# Patient Record
Sex: Male | Born: 1995 | Hispanic: Refuse to answer | Marital: Single | Smoking: Never smoker
Health system: Southern US, Community
[De-identification: ages and names within clinical notes are randomized; demographics above are authoritative.]

## PROBLEM LIST (undated history)

## (undated) DIAGNOSIS — K2970 Gastritis, unspecified, without bleeding: Secondary | ICD-10-CM

## (undated) DIAGNOSIS — K279 Peptic ulcer, site unspecified, unspecified as acute or chronic, without hemorrhage or perforation: Secondary | ICD-10-CM

## (undated) DIAGNOSIS — R55 Syncope and collapse: Secondary | ICD-10-CM

## (undated) DIAGNOSIS — K219 Gastro-esophageal reflux disease without esophagitis: Secondary | ICD-10-CM

## (undated) DIAGNOSIS — E876 Hypokalemia: Secondary | ICD-10-CM

## (undated) DIAGNOSIS — R000 Tachycardia, unspecified: Secondary | ICD-10-CM

## (undated) HISTORY — DX: Gastro-esophageal reflux disease without esophagitis: K21.9

## (undated) HISTORY — DX: Tachycardia, unspecified: R00.0

## (undated) HISTORY — DX: Gastritis, unspecified, without bleeding: K29.70

## (undated) HISTORY — DX: Syncope and collapse: R55

## (undated) HISTORY — DX: Hypomagnesemia: E83.42

## (undated) HISTORY — DX: Peptic ulcer, site unspecified, unspecified as acute or chronic, without hemorrhage or perforation: K27.9

## (undated) HISTORY — DX: Hypokalemia: E87.6

---
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
## 2013-01-02 ENCOUNTER — Telehealth: Payer: Self-pay

## 2013-01-02 NOTE — Telephone Encounter (Signed)
Called pt to schedule flup per dr Kirke Corin, states he is trying to get things worked out with his insurance and he will call back toward the end of the week.

## 2013-01-02 NOTE — Telephone Encounter (Signed)
Per dr Kirke Corin.Marland KitchenMarland KitchenAmun Carr (DOB 1996/01/10): schedule him for a new office visit. Referred from [facility] ER for SVT. Unable to leave msg x 2...the patient vm full

## 2013-01-05 ENCOUNTER — Ambulatory Visit: Payer: Self-pay | Admitting: Cardiovascular Disease

## 2013-01-16 ENCOUNTER — Encounter: Payer: Self-pay | Admitting: *Deleted

## 2013-01-17 ENCOUNTER — Encounter: Payer: Self-pay | Admitting: *Deleted

## 2013-01-19 ENCOUNTER — Ambulatory Visit (INDEPENDENT_AMBULATORY_CARE_PROVIDER_SITE_OTHER): Payer: Self-pay | Admitting: Cardiovascular Disease

## 2013-01-19 ENCOUNTER — Encounter: Payer: Self-pay | Admitting: Cardiovascular Disease

## 2013-01-19 VITALS — BP 116/78 | HR 82 | Ht 68.0 in | Wt 178.5 lb

## 2013-01-19 DIAGNOSIS — R000 Tachycardia, unspecified: Secondary | ICD-10-CM

## 2013-01-19 DIAGNOSIS — E876 Hypokalemia: Secondary | ICD-10-CM

## 2013-01-19 MED ORDER — METOPROLOL TARTRATE 25 MG PO TABS: 25.0000 mg | ORAL_TABLET | Freq: Two times a day (BID) | ORAL | Status: AC

## 2013-01-19 NOTE — Assessment & Plan Note (Signed)
The EKGs that I reviewed her old suggestive of sinus tachycardia. However, there is mention of supraventricular tachycardia as well. This has been happening in the setting of severe electrolyte abnormalities including hypokalemia and hypomagnesemia. Echocardiogram last year showed no evidence of structural heart disease. I think the priority is to treat his underlying electrolyte abnormalities. I recommended treatment with metoprolol 25 mg twice daily as needed for tachycardia in the meanwhile.

## 2013-01-19 NOTE — Patient Instructions (Addendum)
Start Metoprolol 25 mg as needed for fast heart beats /palpitations.   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    Follow up in 3 months.

## 2013-01-19 NOTE — Assessment & Plan Note (Signed)
This seems to be a medical mystery. 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 However, there is no family history of this. I recommend further endocrinology evaluation at a tertiary care center.

## 2013-01-19 NOTE — Progress Notes (Signed)
HPI  This is a 17 year old man who is referred for evaluation of tachycardia. He has recurrent presentations with unexplained hypokalemia, hypomagnesemia, weakness and associated palpitations and narrow complex tachycardia most of the time sinus tachycardia. This has been going on since last year and has required 5 hospital admissions. He presents with sudden symptoms of weakness, shakiness, occasional syncope with palpitations and dizziness. During all these episodes, he was noted to be severely hypokalemic (potassium of less than 3) with hypomagnesemia as well. He underwent extensive workup nephrology and was seen by Dr. Cherylann Ratel. He underwent diuretics screen which was negative. He was seen by Dr. Tedd Sias from endocrinology as well without obvious cause. His vital function has been normal. There is no family history of something similar. He has been taking potassium and magnesium supplements routinely. He denies taking any other supplements or over-the-counter medications. He denies any recreational drug use. He had routine labs done with his primary care physician on June 24 which showed a potassium of 4.5 and magnesium of 1.8. He presented to [facility] emergency room on June 27 with his usual symptoms and was noted to be hypokalemic with a potassium of 2.9 and magnesium of 1.2. He was tachycardic with a heart rate of 139 beats per minute and responded to oral metoprolol.  He had an echocardiogram done in October of last year which showed normal LV systolic function without significant valvular abnormalities.   No Known Allergies   Current Outpatient Prescriptions on File Prior to Visit  Medication Sig Dispense Refill  . magnesium oxide (MAG-OX) 400 MG tablet Take 400 mg by mouth 2 (two) times daily.      . ondansetron (ZOFRAN) 4 MG tablet Take 4 mg by mouth every 8 (eight) hours as needed for nausea.      . potassium chloride SA (K-DUR,KLOR-CON) 20 MEQ tablet Take 20 mEq by mouth 2 (two) times daily.        No current facility-administered medications on file prior to visit.     Past Medical History  Diagnosis Date  . Hypokalemia   . Hypomagnesemia   . Gastritis   . Peptic ulcer disease   . GERD (gastroesophageal reflux disease)   . Syncope and collapse   . Tachycardia      History reviewed. No pertinent past surgical history.   Family History  Problem Relation Age of Onset  . Hypertension Mother   . Hyperlipidemia Mother   . Hypertension Father      History   Social History  . Marital Status: Single    Spouse Name: N/A    Number of Children: N/A  . Years of Education: N/A   Occupational History  . Not on file.   Social History Main Topics  . Smoking status: Never Smoker   . Smokeless tobacco: Not on file  . Alcohol Use: No  . Drug Use: No  . Sexually Active: Not on file   Other Topics Concern  . Not on file   Social History Narrative  . No narrative on file     ROS At 10 point review of system was performed and is negative other than what is mentioned in the history of present illness.    PHYSICAL EXAM   BP 116/78  Pulse 82  Ht 5\' 8"  (1.727 m)  Wt 178 lb 8 oz (80.967 kg)  BMI 27.15 kg/m2 Constitutional: He is oriented to person, place, and time. He appears well-developed and well-nourished. No distress.  HENT: No  nasal discharge.  Head: Normocephalic and atraumatic.  Eyes: Pupils are equal and round. Right eye exhibits no discharge. Left eye exhibits no discharge.  Neck: Normal range of motion. Neck supple. No JVD present. No thyromegaly present.  Cardiovascular: Normal rate, regular rhythm, normal heart sounds and. Exam reveals no gallop and no friction rub. No murmur heard.  Pulmonary/Chest: Effort normal and breath sounds normal. No stridor. No respiratory distress. He has no wheezes. He has no rales. He exhibits no tenderness.  Abdominal: Soft. Bowel sounds are normal. He exhibits no distension. There is no tenderness. There is no  rebound and no guarding.  Musculoskeletal: Normal range of motion. He exhibits no edema and no tenderness.  Neurological: He is alert and oriented to person, place, and time. Coordination normal.  Skin: Skin is warm and dry. No rash noted. He is not diaphoretic. No erythema. No pallor.  Psychiatric: He has a normal mood and affect. His behavior is normal. Judgment and thought content normal.       ZOX:WRUEAV sinus rhythm with T wave changes in the inferior and anterior leads.  ASSESSMENT AND PLAN

## 2013-01-25 ENCOUNTER — Encounter: Payer: Self-pay | Admitting: *Deleted

## 2013-03-23 ENCOUNTER — Encounter: Payer: Self-pay | Admitting: *Deleted

## 2013-03-23 ENCOUNTER — Ambulatory Visit: Payer: Self-pay | Admitting: Cardiovascular Disease

## 2014-02-09 IMAGING — CR RIGHT THUMB 2+V
1 series · 3 of 3 positions shown · non-contrast
Comparison: none

REASON FOR EXAM: injury
COMMENTS:

PROCEDURE:     DXR - DXR THUMB RIGHT HAND (1ST DIGIT)  - February 20, 2012  [DATE]
RESULT:     Comparison:  None

[Series 1: x finger obl right · 0.14mm/px · 3 of 3 slices shown]
[im 1/3]
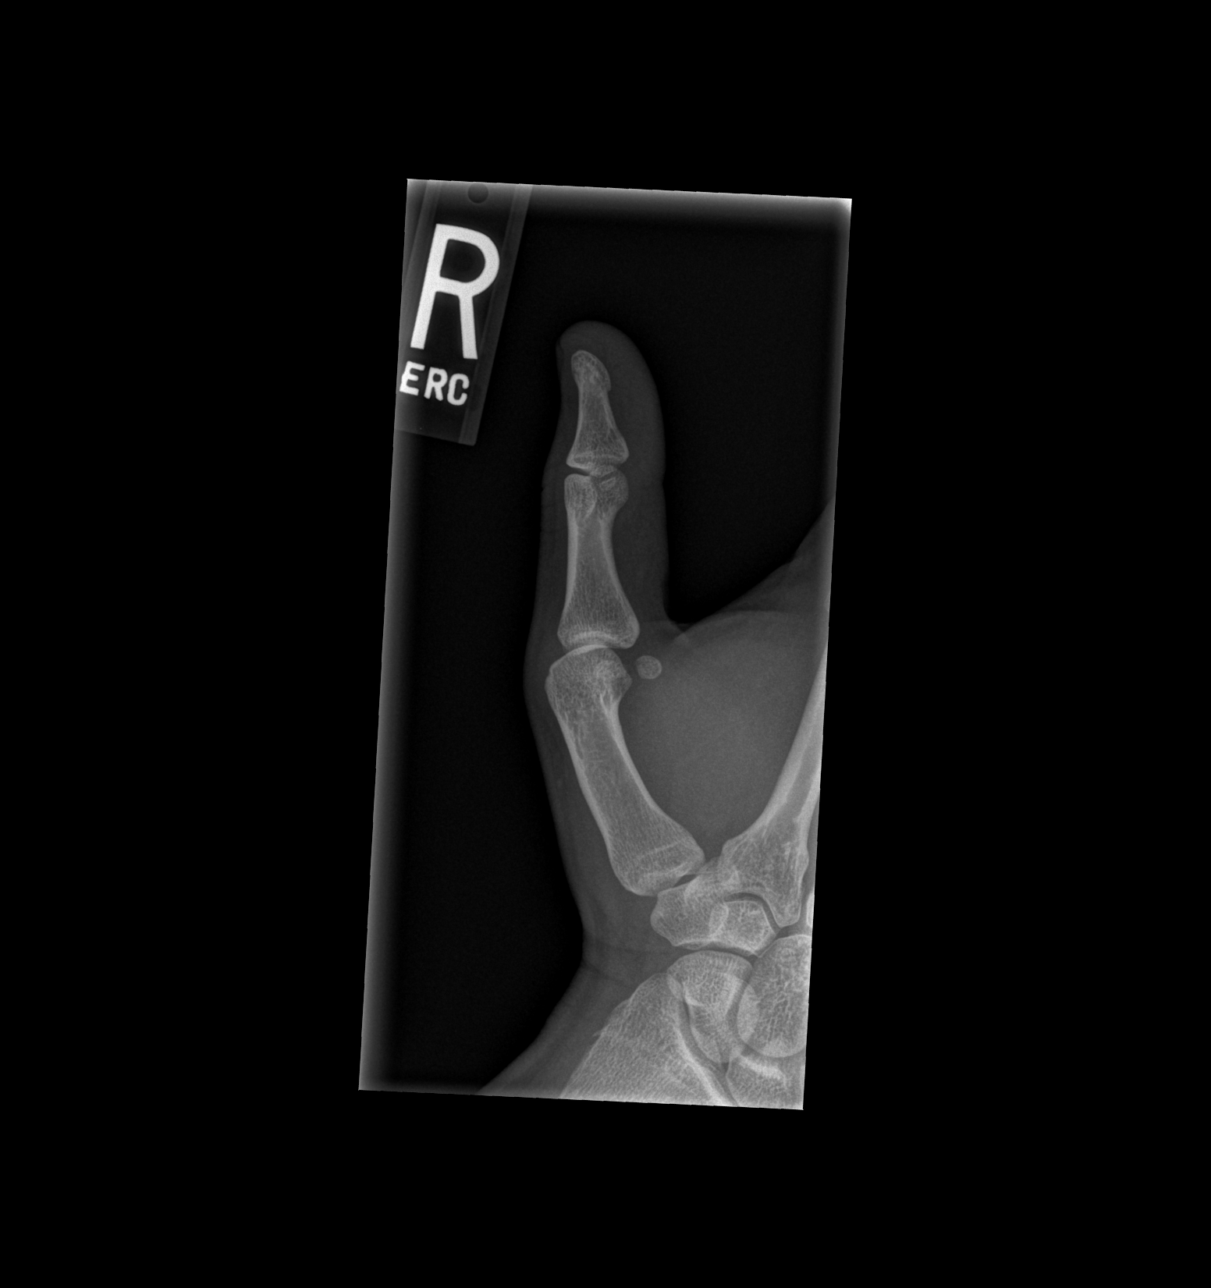
[im 2/3]
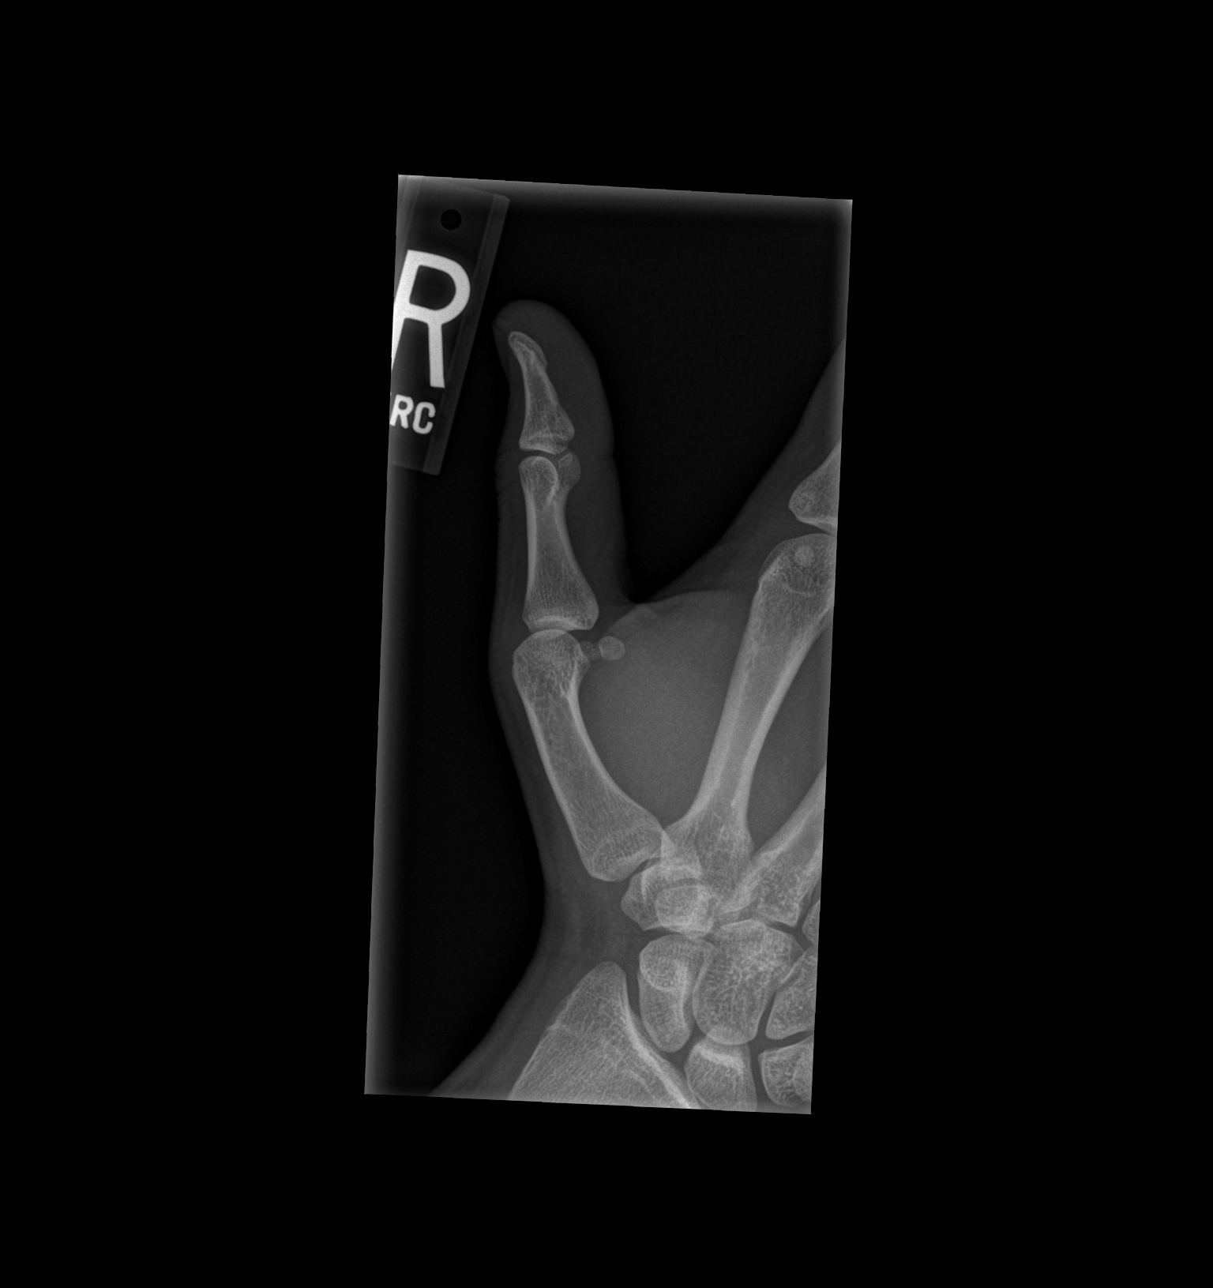
[im 3/3]
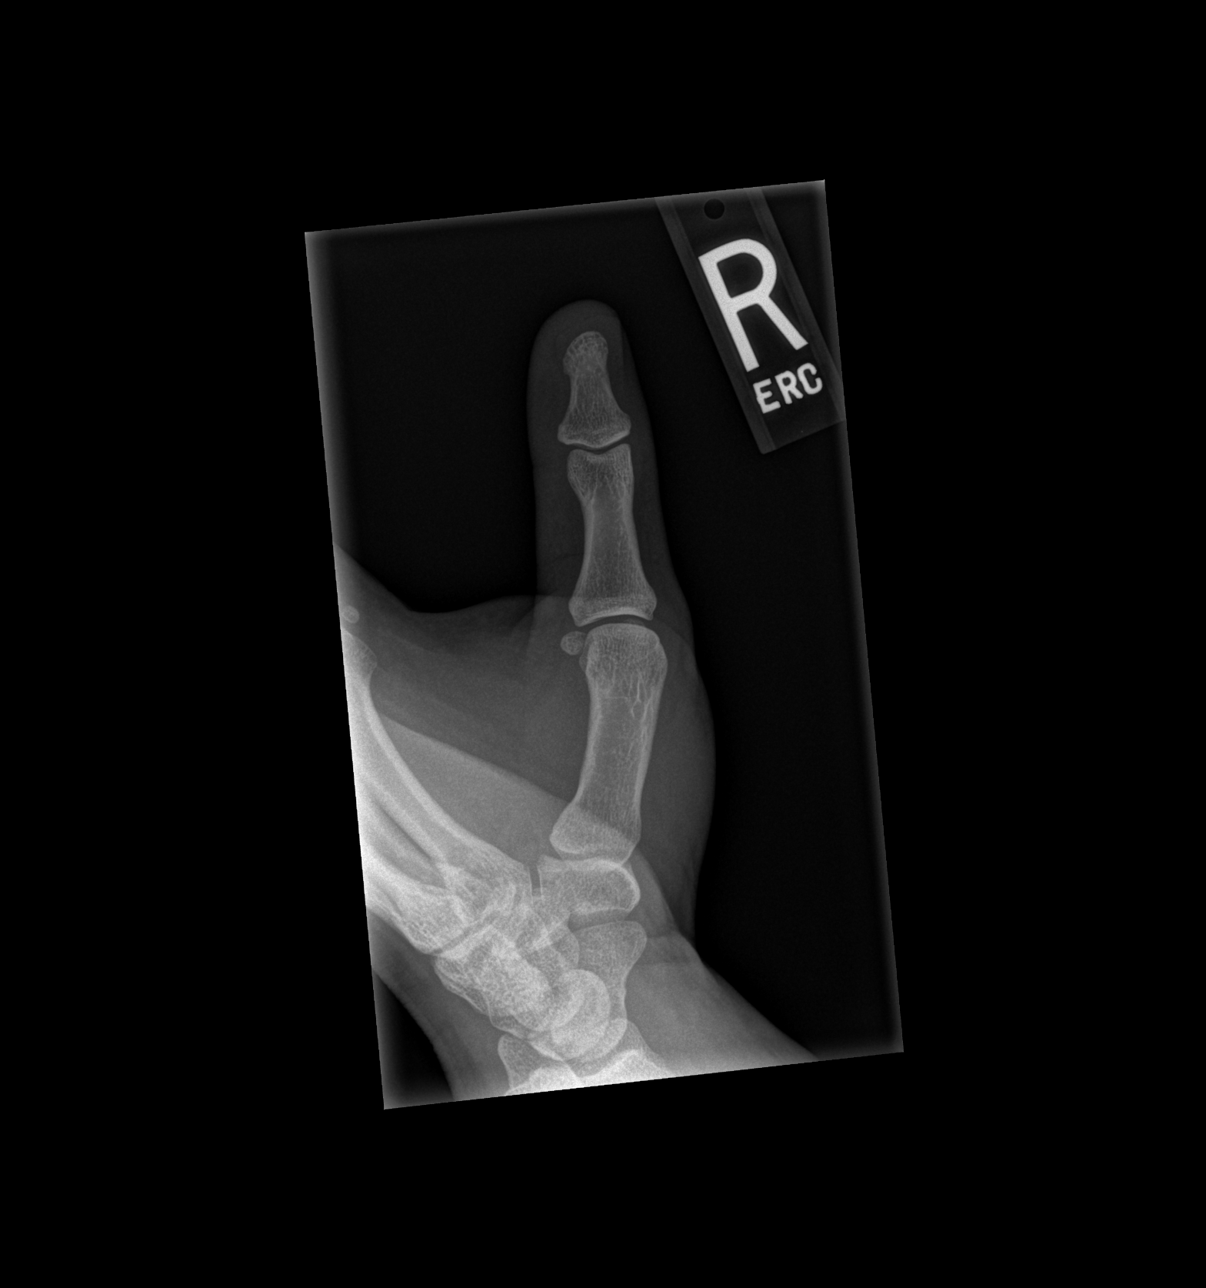

[3 of 3 positions shown; findings below may reference images not displayed]

FINDINGS: Three coned-down views of the right thumb demonstrates no fracture or
dislocation. The soft tissues are normal.
IMPRESSION: No acute osseous injury of the right thumb.

[REDACTED]

## 2014-02-12 IMAGING — CR DG CHEST 2V
1 series · 2 of 2 positions shown · non-contrast
Comparison: none

REASON FOR EXAM: pain
COMMENTS:

[Series 4: w chest pa · 0.14mm/px · 2 of 2 slices shown]
[im 1/2]
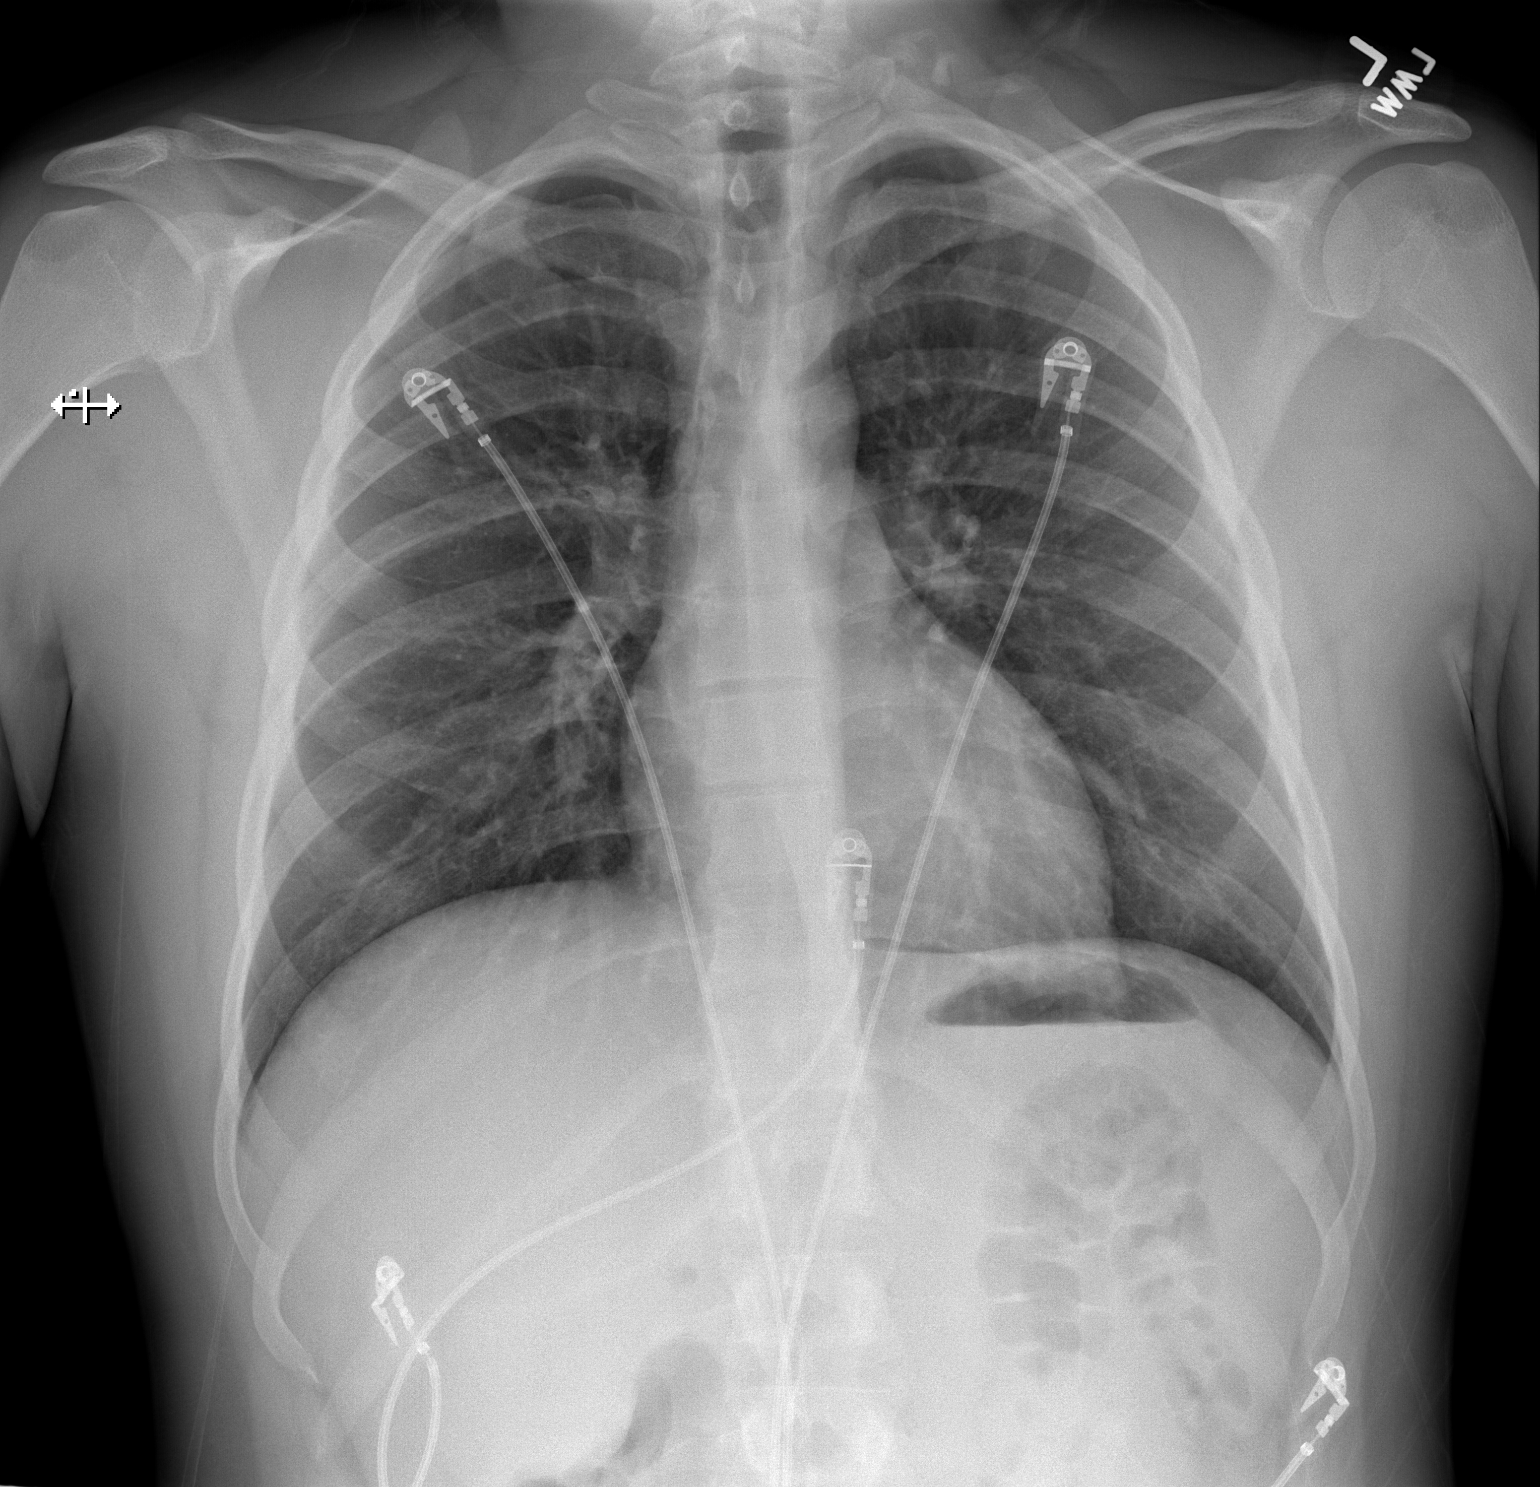
[im 2/2]
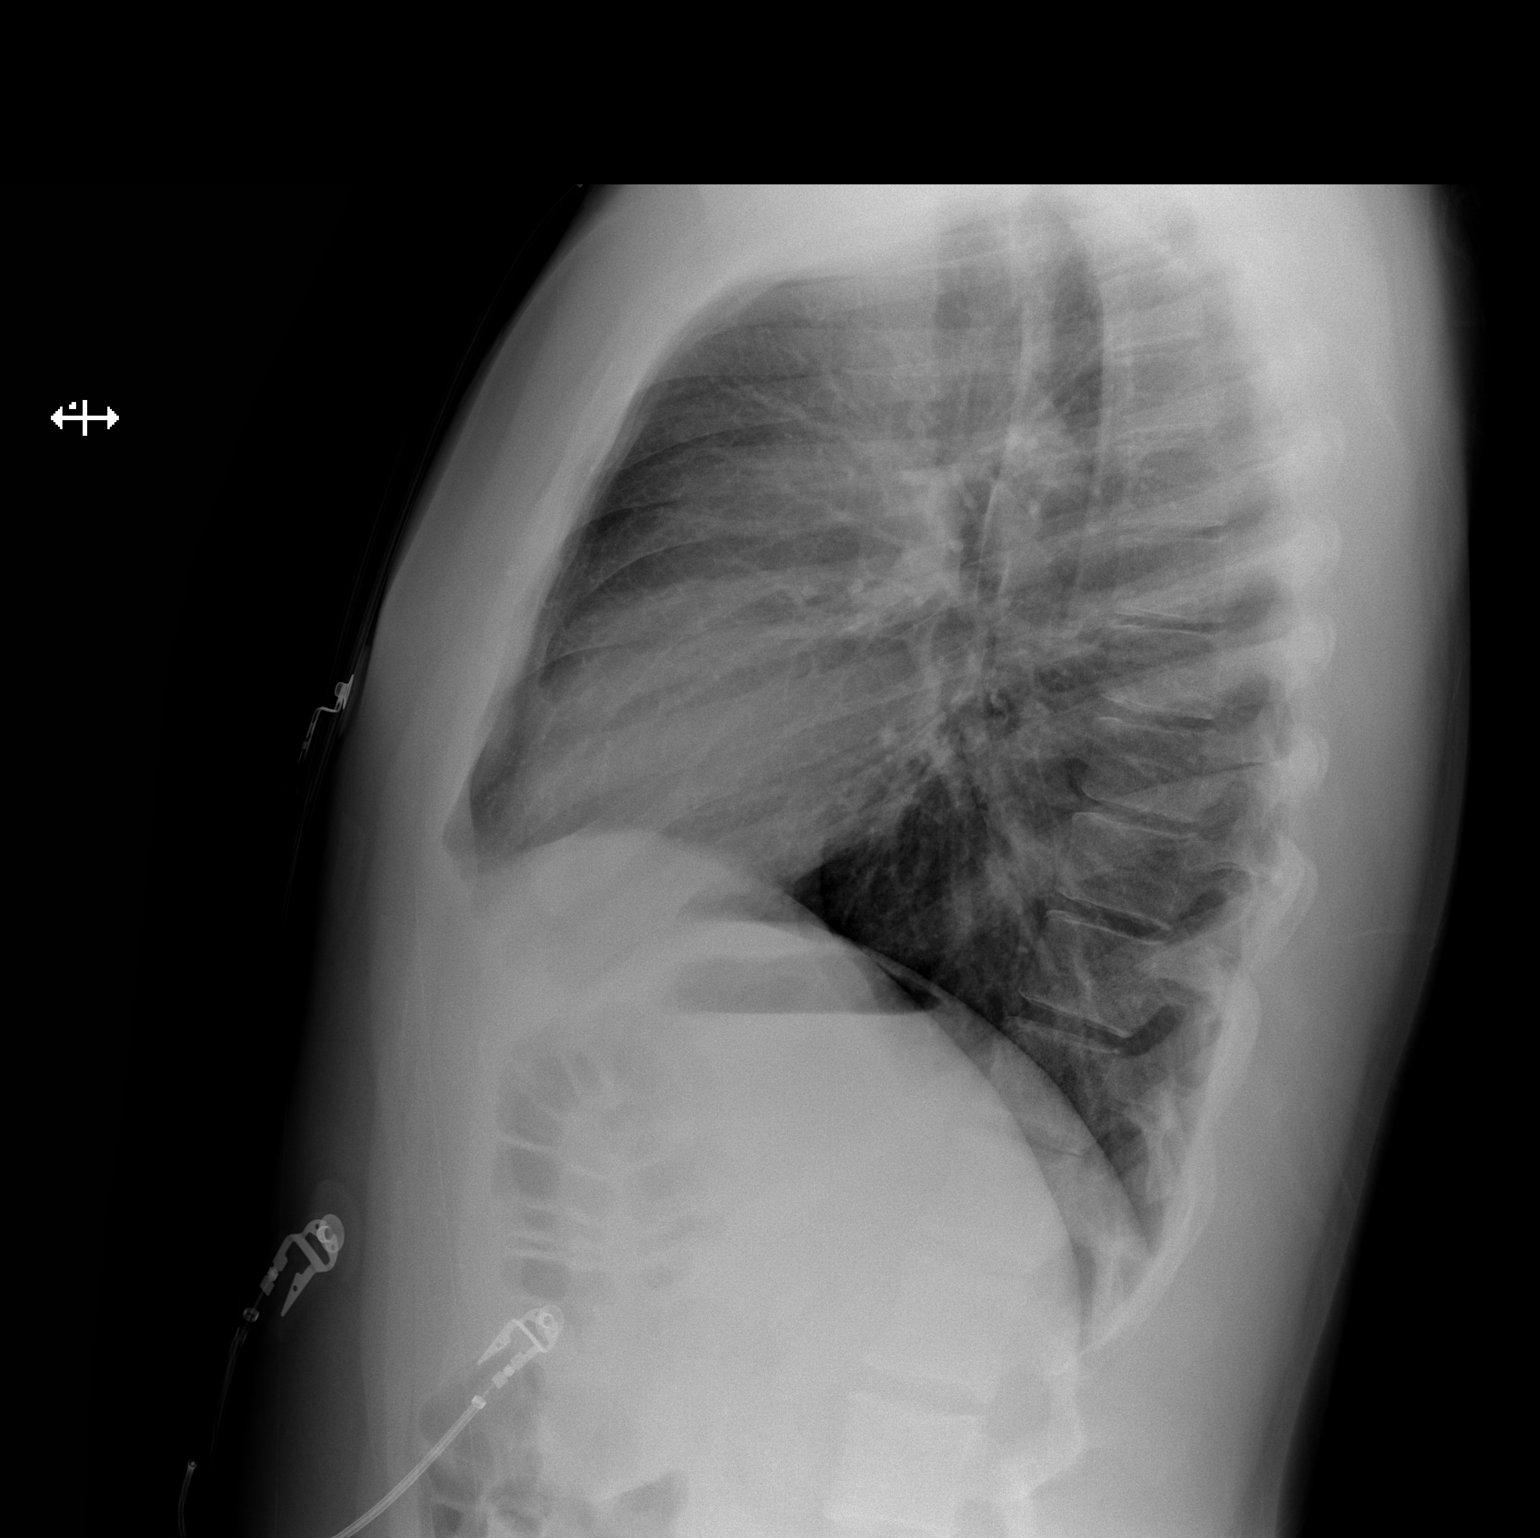

[2 of 2 positions shown; findings below may reference images not displayed]

PROCEDURE:     DXR - DXR CHEST PA (OR AP) AND LATERAL  - February 23, 2012  [DATE]

RESULT:     The lungs are adequately inflated. The interstitial markings are
mildly increased especially in the perihilar regions. The cardiac silhouette
is normal in size. The pulmonary vascularity is not engorged. There is no
pleural effusion. The mediastinum is normal in width.
IMPRESSION: There are mildly increased interstitial markings which may
reflect subsegmental atelectasis or very early interstitial type pneumonia.
There is no pleural effusion. A followup PA and lateral chest x-ray
following therapy would be useful.

[REDACTED]

## 2014-04-25 IMAGING — US US RENAL KIDNEY
1 series · 14 of 25 positions shown · non-contrast
Comparison: none

REASON FOR EXAM: evaluate for adrenal glands.
COMMENTS:

PROCEDURE:     US  - US KIDNEY  - May 05, 2012  [DATE]
RESULT:     The right kidney measures 10.17 x 6.19 x 6.11 cm. The left
kidney measures 10.97 x 5.6 x 5.14 cm. There is a small amount of urine in
the bladder. Neither kidney shows a stone, mass or cyst. No obstructive
change is evident.

[Series 1: us renal kidney · 0.28mm/px · 14 of 60 slices shown]
[im 1/60]
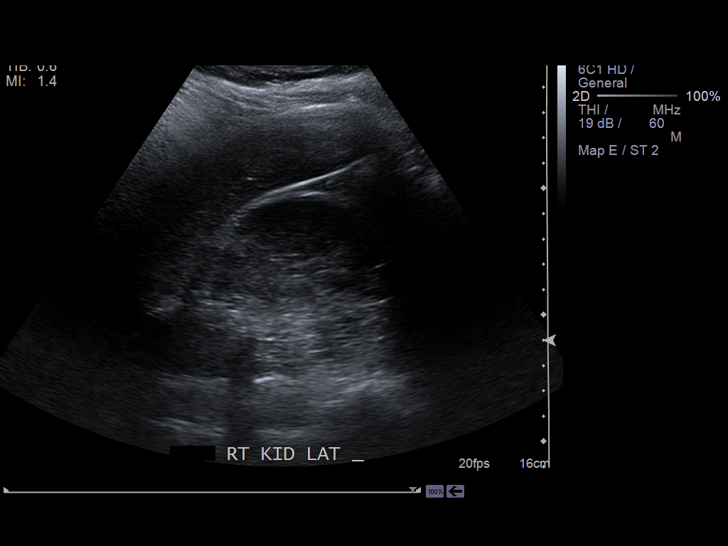
[im 5/60]
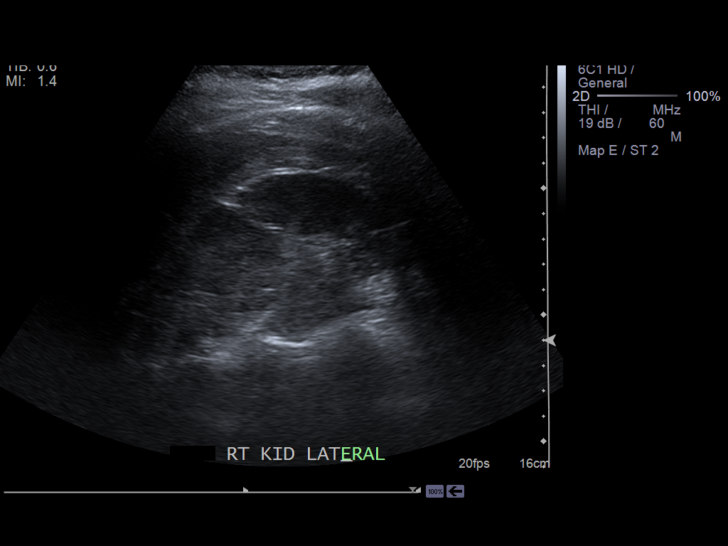
[im 10/60]
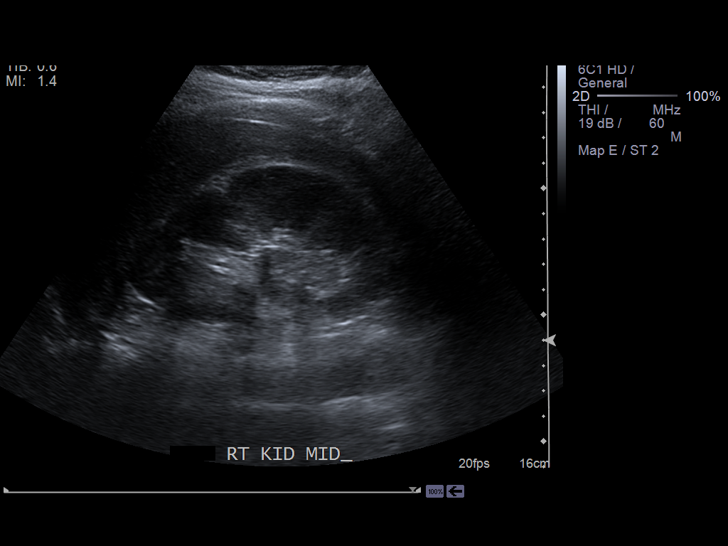
[im 15/60]
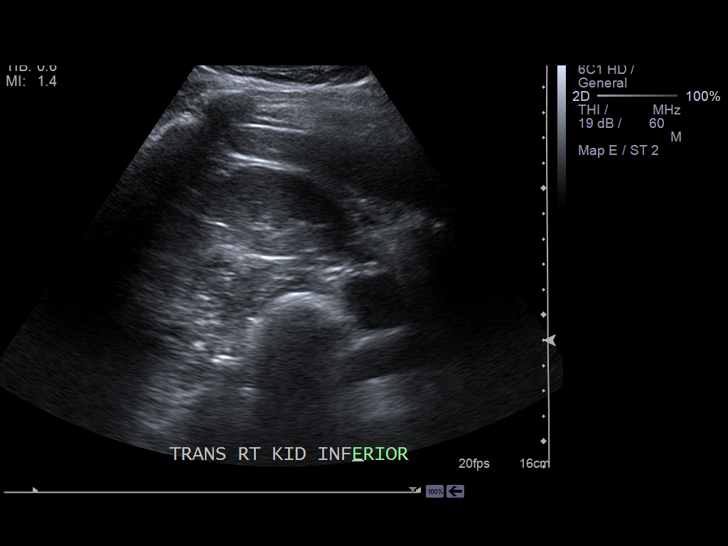
[im 20/60]
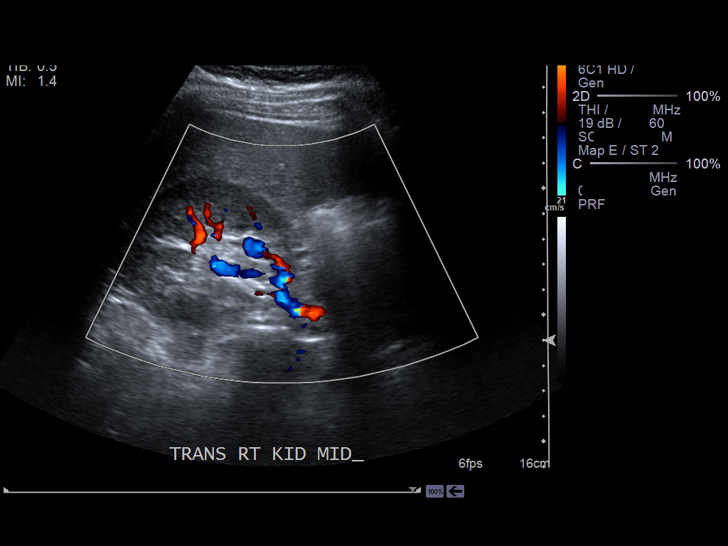
[im 23/60]
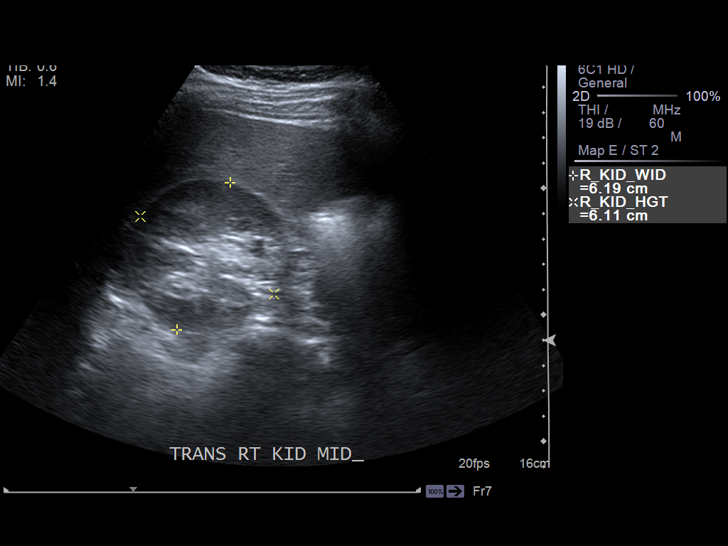
[im 28/60]
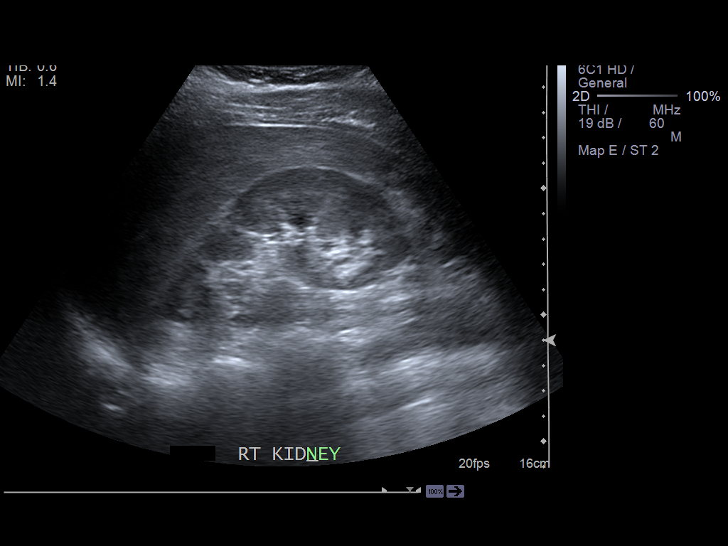
[im 32/60]
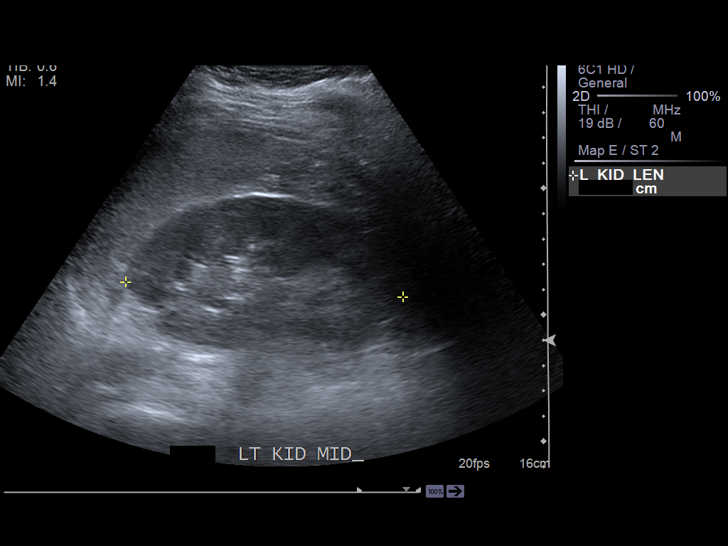
[im 37/60]
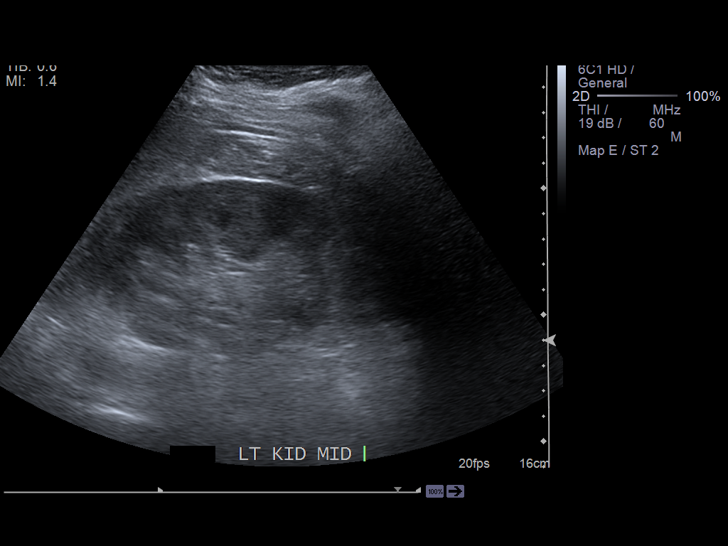
[im 40/60]
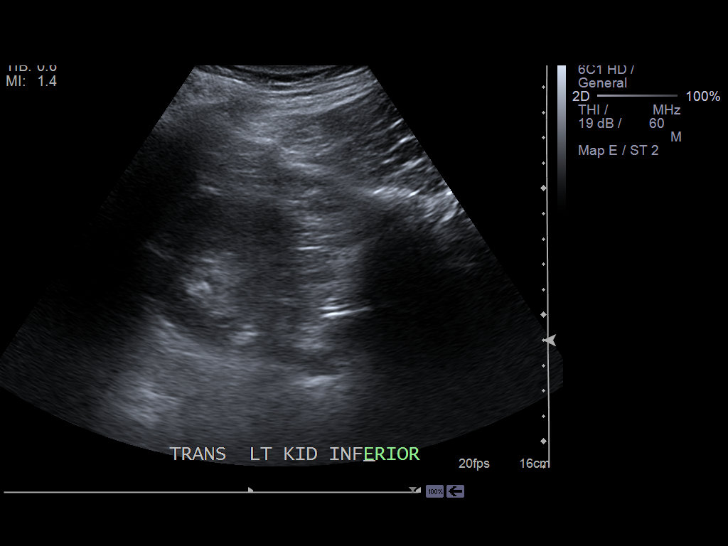
[im 45/60]
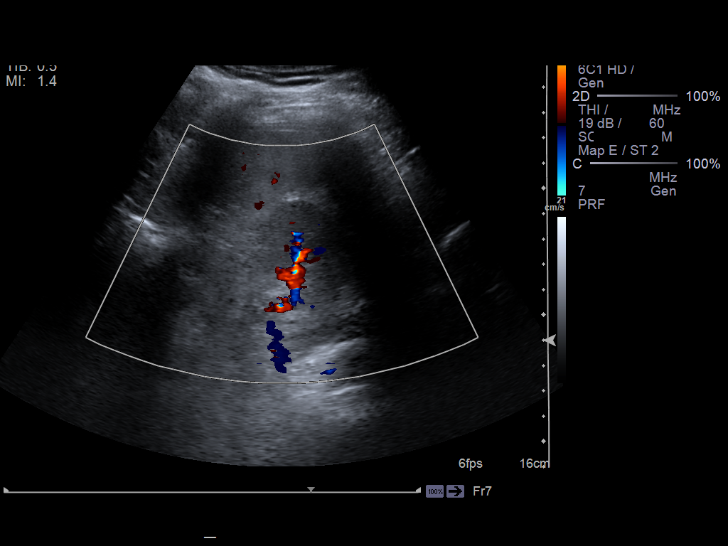
[im 50/60]
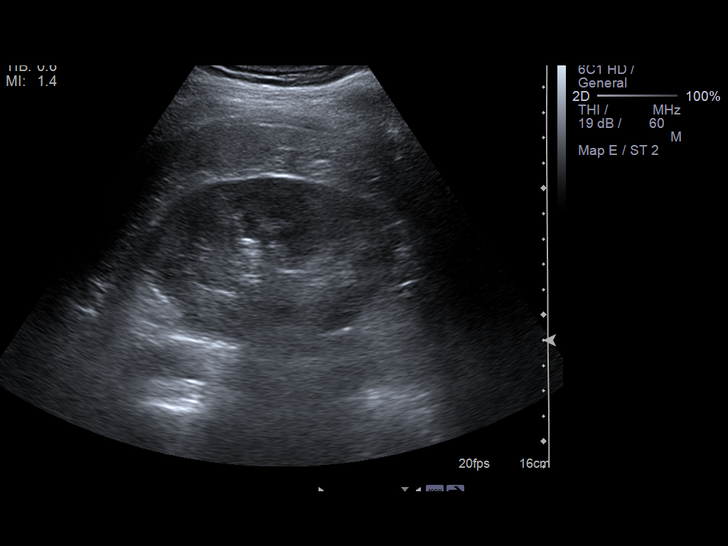
[im 55/60]
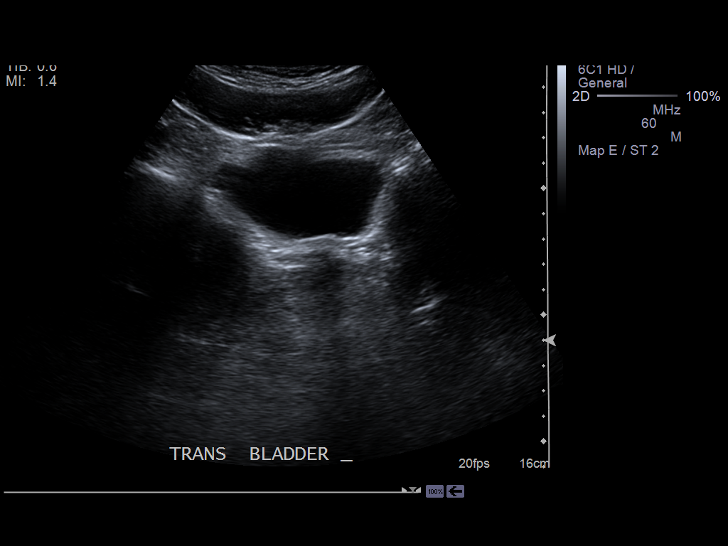
[im 60/60]
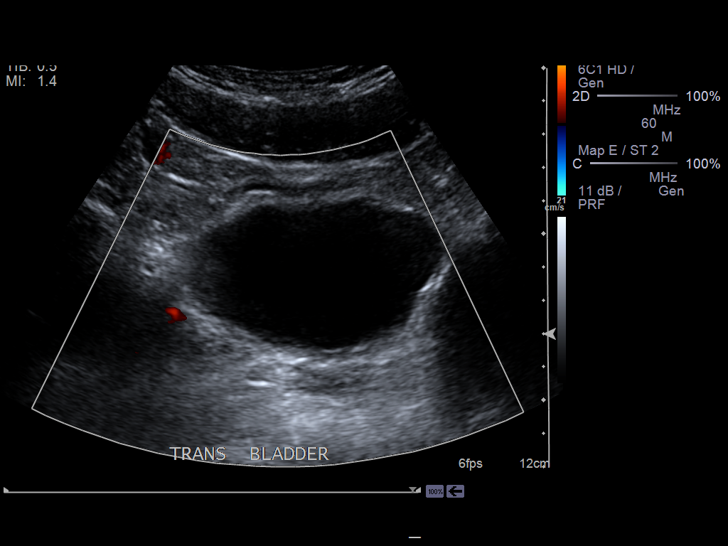

[14 of 25 positions shown; findings below may reference images not displayed]

IMPRESSION: 1. No focal abnormality evident.

[REDACTED]

## 2014-10-23 NOTE — Consult Note (Signed)
Chief Complaint:   Subjective/Chief Complaint feeling about the same, possible a little better. no emesis overnight.   VITAL SIGNS/ANCILLARY NOTES: **Vital Signs.:   02-Sep-13 08:15   Vital Signs Type Q 4hr   Temperature Temperature (F) 97.8   Celsius 36.5   Temperature Source Oral   Pulse Pulse 77   Pulse source if not from Vital Sign Device per Telemetry Clerk   Respirations Respirations 18   Systolic BP Systolic BP 696   Diastolic BP (mmHg) Diastolic BP (mmHg) 73   Mean BP 86   Pulse Ox % Pulse Ox % 99   Oxygen Delivery Room Air/ 21 %   Telemetry pattern Cardiac Rhythm Normal sinus rhythm; pattern reported by Telemetry Clerk  *Intake and Output.:   Shift 02-Sep-13 15:00   Length of Stay Totals Intake:  2861 Output:  975    Net:  1886   Brief Assessment:   Cardiac Regular    Respiratory clear BS    Gastrointestinal details normal Soft  Nondistended  No masses palpable  Bowel sounds normal  No rebound tenderness  mild tenderness generalized except the rlq, moredo noted in the luq medially   Lab Results: Routine Chem:  02-Sep-13 03:49    Glucose, Serum 86   BUN  5   Creatinine (comp) 1.05   Sodium, Serum 141   Potassium, Serum 3.5   Chloride, Serum  110   CO2, Serum  27   Calcium (Total), Serum  8.6   Anion Gap  4   Osmolality (calc) 278   eGFR (African American) >60   eGFR (Non-African American) >60 (eGFR values <43m/min/1.73 m2 may be an indication of chronic kidney disease (CKD). Calculated eGFR is useful in patients with stable renal function. The eGFR calculation will not be reliable in acutely ill patients when serum creatinine is changing rapidly. It is not useful in  patients on dialysis. The eGFR calculation may not be applicable to patients at the low and high extremes of body sizes, pregnant women, and vegetarians.)   Magnesium, Serum  1.5 (1.8-2.4 THERAPEUTIC RANGE: 4-7 mg/dL TOXIC: > 10 mg/dL  -----------------------)  Routine Hem:  02-Sep-13  03:49    WBC (CBC) 6.0 (Result(s) reported on 07 Mar 2012 at 05:12AM.)   Assessment/Plan:  Assessment/Plan:   Assessment 1) luq pain, n/v improving. suspicion of PUDz in the setting of nsaid use.    Plan 1) egd today, I have discussed the risks benefits and complications of egd tininclude not limited to bleeding infectio perforation and sedation and he/parents wish to proceed.  Further recs to follow.   Electronic Signatures: SLoistine Simas(MD)  (Signed 02-Sep-13 09:10)  Authored: Chief Complaint, VITAL SIGNS/ANCILLARY NOTES, Brief Assessment, Lab Results, Assessment/Plan   Last Updated: 02-Sep-13 09:10 by SLoistine Simas(MD)

## 2014-10-23 NOTE — Consult Note (Signed)
Chief Complaint and History:   Referring Physician Dr. Allena KatzPatel    Chief Complaint Hypokalemia   Allergies:  No Known Allergies:   Assessment/Plan:   Assessment/Plan 19 year-old male seen in consultation. He was interviewed and examined and chart was reviewed. He has a history of recurrent hypokalemia, hypomagnesemia, and hypocalcemia. This is his 3rd admission since August. He presented yesterday with altered mental status, tachycardia, and was found to have a potassium level of 2.1, Mg level of 1.4, and calcium level of 8.5. His mental status has normalized. Pt and his mother provided the history. He denies N/V/diarrhea. He denies use of diuretics or laxatives. His potassium has normalized after supplemetation. His TSH is wnl. He has multiple labs ordered at this time. I plan to additionally check a morning ACTH and cortisol as well as a PTH level.   A full consult will be dictated.  I will follow with you.   Electronic Signatures: Raj JanusSolum, Anna M (MD)  (Signed 31-Oct-13 13:48)  Authored: Chief Complaint and History, ALLERGIES, Assessment/Plan   Last Updated: 31-Oct-13 13:48 by Raj JanusSolum, Anna M (MD)

## 2014-10-23 NOTE — Consult Note (Signed)
PATIENT NAME:  Zipporah PlantsHANN, Lonnie M MR#:  [id number] DATE OF BIRTH:  Aug 18, 1995  DATE OF CONSULTATION:  05/05/2012  REFERRING PHYSICIAN:   Auburn BilberryShreyang Patel, MD CONSULTING PHYSICIAN:  A. Wendall MolaMelissa Genelle Economou, MD  CHIEF COMPLAINT: Electrolyte imbalance.   HISTORY OF PRESENT ILLNESS: This is an 19 year old male seen in consultation for electrolyte imbalance.  The patient was admitted yesterday from the ED after presenting with altered mental status and tachycardia. He was found to have a low potassium of 2.1, a low magnesium of 1.4, elevated sodium of 144, elevated chloride of 108, and low calcium of 8.4. He has had two prior admissions with similar findings of low potassium, low magnesium, and low calcium since August. The patient and his mother were interviewed. They explained that he has been fairly busy in recent days with the recent marriage of his brother. He was feeling unwell most of yesterday and slept throughout the day. In the evening his brother found him and was unable to arouse him and called EMS. He presented with tachycardia with a heart rate in the 130s. For the first few hours after presentation to the ED, he remained altered; however, his mental status has since normalized. He does not have much recollection of events between 10:00 p.m. and 1:00 a.m. He denies any nausea or vomiting. He denies any change in bowel habits. He denies use of any diuretics or laxatives. He denies use of herbal supplements or vitamins. His only medication is Protonix. He had been feeling well in preceding days other than the fatigue. He did report some slight "twitching" of the muscles of the legs, but no overt cramps. He was not having any palpitations or racing heart rate yesterday. He denies chest pain. His potassium has normalized after supplementation and now measures 3.7. Magnesium was also supplemented and  normalized and now measures 2.2. Calcium remains low normal at 8.5 and sodium remains mildly elevated at 144.   He  was admitted previously in August and September and during each admission initially had hypokalemia, hypomagnesemia, and hypocalcemia. Prior to those admissions he had nausea and vomiting, and electrolyte disturbance was attributed to GI losses. He underwent an EGD showing gastritis and he has been taking Protonix ever since. He has not had any recent travel. He denies neck pain or heat intolerance. He did have a normal TSH of 1.85 from yesterday. A urine drug screen was negative.   PAST MEDICAL HISTORY:  Gastritis.  MEDICATIONS:  Protonix 40 mg daily.   ALLERGIES: No known drug allergies.   SOCIAL HISTORY: The patient lives with his parents. He is employed at an [employer] shop. He does not smoke cigarettes or drink alcohol.   FAMILY HISTORY: Positive for diabetes, hypertension, and coronary disease.  REVIEW OF SYSTEMS:  GENERAL: Denies fevers. He reports a 20-pound weight loss in the last two months, attributed in part to episodes of nausea and vomiting. Appetite is good. HEENT: No blurred vision. No sore throat. NECK: No neck pain. No dysphagia. CARDIAC: No chest pain. No palpitations. PULMONARY: No cough, no shortness of breath. ABDOMEN: No abdominal pain. Again, no nausea, vomiting, or change in bowel habits. EXTREMITIES: Denies leg swelling. SKIN: Denies rash or recent skin change. Denies pruritus. ENDO: Denies heat or cold intolerance. Denies polyuria. GU: Denies dysuria and hematuria. NEUROLOGIC: Denies recent falls.   PHYSICAL EXAMINATION:  VITAL SIGNS: Height 67.9 inches, weight 188 pounds, BMI 28.6, temperature 97.6, respirations 18, blood pressure 121/76, pulse oximetry 98%.   GENERAL: Well-appearing, well-nourished  young Hispanic male in no acute distress.   HEENT: Extraocular movements are intact.  Oropharynx is clear. Mucous membranes moist.   NECK: Supple. No thyromegaly. Thyroid is smooth and without nodularity.   LYMPH: No submandibular or anterior cervical lymphadenopathy.    CARDIAC: Regular rate and rhythm without audible murmur. No carotid bruit.   LUNGS: Clear to auscultation bilaterally. No rales, wheeze, or rhonchi. Good inspiratory effort.   ABDOMEN: Diffusely soft, nontender, nondistended. No hepatosplenomegaly.   EXTREMITIES: No leg edema is present.   SKIN: No dermatopathy or rash is noted.   PSYCHIATRIC: Alert and oriented times three, calm, and cooperative.   LABORATORY, DIAGNOSTIC, AND RADIOLOGICAL DATA: Labs today include sodium 144, creatinine 1.05, BUN 9, glucose 122, potassium 3.7, chloride 110, calcium 8.5, magnesium 2.2.   ASSESSMENT: This is an 19 year old male with multiple electrolyte disturbances including hypokalemia, hypomagnesemia, hypocalcemia, and hypernatremia. Causes may include GI or urinary losses. He denies any vomiting, diarrhea, polyuria, or use of diuretics. However, we always have to have a suspicion for potential laxative or diuretic surreptitious use. Hyperaldosteronism could explain the hypokalemia, however is unlikely in the absence of hypertension. Hypokalemic periodic paralysis would explain the hypokalemia and perhaps the altered mental status; however, there is no evidence of thyroid dysfunction, making this unlikely. Bartter or Gitelman syndrome could explain the hypokalemia; however, Gitelman is often accompanied by medical alkalosis, which he has not had on this hospitalization. These conditions also would not necessarily explain the persistent hypocalcemia.    RECOMMENDATIONS:  1. I agree with replacing electrolytes as you have done.  2. Multiple labs remain to be collected and are pending to date to include a 24-hour urine potassium, cortisol, and renin-aldosterone levels. These will again be helpful in ruling out some of the conditions mentioned above.  3. I will add to this a PTH level and morning cortisol with paired ACTH to be collected tomorrow.   Thank you for the kind request for consultation. I will follow  along with you and provide further guidance as indicated.    ____________________________ A. Wendall Mola, MD ams:bjt D: 05/05/2012 16:04:14 ET T: 05/05/2012 17:34:31 ET JOB#: [id number]  cc: A. Wendall Mola, MD, <Dictator> Macy Mis MD ELECTRONICALLY SIGNED 05/06/2012 12:53

## 2014-10-23 NOTE — Consult Note (Signed)
Brief Consult Note: Diagnosis: abdominal pain, n/v.  abnormal ct.   Patient was seen by consultant.   Consult note dictated.   Recommend to proceed with surgery or procedure.   Recommend further assessment or treatment.   Orders entered.   Comments: Please see full GI consult.  Patietn presenting with n/v for 2 weeks.  Patietn had antecedant intermittant nausea with fatty foods , with food avoidance for bout 6 months.  Patietn has been taking IBP multiple times a week even up to 1200 mg po at a time.  CT reviewed.  My concern at this point is for possible peptic ulcer disease related to excessive IBP use.  CT could show mild ileitis, however with frre fluid and no stranding in that area posssibly also artifact.  Will arrange for EGD tomorrow. I have discussed the risks benefits and complicatiosn of egd to include not limited to bleeding infection perforation and sedation and he and family wish to proceed.  Will need anesthesia assistance.  Electronic Signatures: Barnetta ChapelSkulskie, Martin (MD)  (Signed 01-Sep-13 16:19)  Authored: Brief Consult Note   Last Updated: 01-Sep-13 16:19 by Barnetta ChapelSkulskie, Martin (MD)

## 2014-10-23 NOTE — Consult Note (Signed)
PATIENT NAME:  Lonnie Carr, Lonnie Carr MR#:  [id number] DATE OF BIRTH:  01/31/1996  DATE OF CONSULTATION:  03/06/2012  REFERRING PHYSICIAN:  Katha Hamming, MD CONSULTING PHYSICIAN:  Christena Deem, MD  REASON FOR CONSULTATION: An 19 year old male with ileitis, and acute renal failure, and possible IBD.   HISTORY OF PRESENT ILLNESS: Lonnie Carr is an 19 year old Caucasian male who came to the hospital yesterday with recurrent issues of nausea and vomiting. He stated that about two weeks ago he was working on his car and dropped something on his thumb. At that time, he came to the Emergency Room. He was given some Vicodin and sent home. He took some Vicodin, started to have some vomiting, and then he came back to the Emergency Room. He was treated with some IV fluids and a chest film was done this showing some mildly increased interstitial markings with reflecting possibly subsegmental atelectasis or very early interstitial type pneumonia. The patient was afebrile. He went home after this and seemed to get better beginning earlier this week. Then yesterday he had some increased emesis and what was more concerning was that there was a small amount of blood in the emesis. The last emesis was yesterday at about 4:30 p.m. He came to the Emergency Room for further evaluation, had a CT scan of the abdomen done, and was admitted. Today he did eat breakfast that did not bother him. He ate a soft diet for lunch with some minimal nausea but no emesis. He states that he has been having some mild discomfort to the left and superior to the umbilicus, more so in the left upper quadrant region. He has had no heartburn or dysphagia. He has been having a daily bowel movement, no black stools, blood in the stools, or slimy stools. When asked whether or not he had been having a change of bowel habits or diarrhea, he stated that this only happened yesterday. He has however been getting episodes of nausea with greasy foods for at  least 6 to 12 months. In that regard, he has actually been avoiding certain foods. He does use hot sauce quite a bit on foods. He states also that he gets frequent headaches and he has been taking ibuprofen at least several times a week. When asked about how much ibuprofen he is taking, he has been taking up to six ibuprofen at a time for his headaches. His CT scan that was done early this morning - impression: normal free fluid in the right pelvis adjacent to loops of distal small bowel, correlate for mild ileitis, normal-appearing appendix, no nephrolithiasis or hydronephrosis present. I have looked at these films myself. There seems to be a fair amount of stool in the cecum. The terminal ileum is perhaps a little bit thickened, however, this could be secondary to the fluid in this region as well. The stomach appears to be somewhat generous and there was a very large amount of fluid in the stomach.   PAST MEDICAL HISTORY: No known medical problems. He denies any previous surgeries or hospitalizations. His mother was there to help with history and he has had no other problems.   GI FAMILY HISTORY: Pertinent for father having gallstones, a grandmother having a cholecystectomy, and an aunt with diverticulitis. There is no family history of inflammatory bowel disease or colorectal cancer.   SOCIAL HISTORY: The patient does not drink alcohol. He does not smoke. He does not use drugs. He just graduated from high school this past year. He  has not started college.  REVIEW OF SYSTEMS: Ten systems reviewed per admission history and physical, agree with same.   PHYSICAL EXAMINATION:   VITAL SIGNS: Temperature 98.2, pulse 85, respirations 20, blood pressure 123/76, and pulse oximetry 97%.   GENERAL: He is a well-appearing 19 year old Caucasian male, no acute distress.   HEAD: Normocephalic, atraumatic.   EYES: Anicteric.   NOSE: Septum midline. No lesions.   OROPHARYNX: No lesions.   NECK: Supple. No  JVD. No lymphadenopathy. No thyromegaly.   HEART: Regular rate and rhythm without rub or gallop.   LUNGS: Bilaterally clear.   ABDOMEN: Soft. He is tender to palpation in the lower medial left upper quadrant. He is slightly tender to palpation as well in the suprapubic region. There is no tenderness in the right or left lower quadrant. There are no masses, rebound, or organomegaly.   RECTAL: Anorectal exam is deferred.   EXTREMITIES: No clubbing, cyanosis, or edema.   NEUROLOGICAL: Cranial nerves II through XII grossly intact. Muscle strength bilaterally equal and symmetric. Deep tendon reflexes bilaterally equal and symmetric.   LABS/RADIOLOGIC STUDIES: On admission he had a glucose of 175, BUN 16, creatinine 1.42, sodium 142, potassium 2.4, chloride 107, bicarbonate 25, and calcium 7.9. Lipase 114. Ethanol less than 3. Hepatic profile showing a total protein of 6.8, albumin 3.7, total bilirubin 0.2, alkaline phosphatase 83, AST 23, and ALT 20. TSH 1.00. Urine drug screen negative. Hemogram showed a white count of 15.1, hemoglobin and hematocrit 13.3/38.3, platelet count 184, and MCV 87.    He had a stool reported as C. difficile negative.   Urinalysis showed 1+ ketones, negative otherwise.   CT scan as noted.   ASSESSMENT: Recurrent nausea and vomiting over the period of the past two weeks. The patient further states that he has been taking multiple doses of ibuprofen a week, up to 1200 mg at a time. This is in the setting of left upper quadrant pain, as described above. I have reviewed the CT. The terminal ileum does indeed look perhaps a little bit thickened, however, with fluid in the region this could be artifact. There appears to be some debris in the cecum. There is no stranding noted. My concern initially is more so for ulcer.   RECOMMENDATIONS: The patient was started on an omeprazole capsule on admission as well as an antiemetic. Further he is now on ciprofloxacin and  metronidazole. We will proceed with EGD for evaluation of his stomach, in regards to the issues as stated above. If this is uninformative, further evaluation may be needed in regards to a lower scope versus small bowel series. I have discussed the risks, benefits, and complications of EGD to include but not limited         to bleeding, infection, perforation, and the risk of sedation with the patient and multiple family members including both parents and they wish to proceed. Further recommendations to follow. ____________________________ Christena DeemMartin U. Nadia Torr, MD mus:slb D: 03/06/2012 16:14:33 ET T: 03/07/2012 08:06:26 ET JOB#: [id number]  cc: Christena DeemMartin U. Berneta Sconyers, MD, <Dictator> Christena DeemMARTIN U Shandi Godfrey MD ELECTRONICALLY SIGNED 03/28/2012 9:04

## 2014-10-23 NOTE — Discharge Summary (Signed)
PATIENT NAME:  Lonnie Carr, Lonnie Carr MR#:  [id number] DATE OF BIRTH:  [birth year]  DATE OF ADMISSION:  03/11/2012 DATE OF DISCHARGE:  03/12/2012  PRIMARY CARE PHYSICIAN:  Dr. Maryellen Pile.   FINAL DIAGNOSES:  1. Sinus tachycardia with palpitations.  2. Hypomagnesemia.  3. Hypokalemia.  4. Peptic ulcer disease on recent endoscopy.  5. Acute renal failure and dehydration which improved.   MEDICATIONS ON DISCHARGE:  1. Protonix 40 mg twice a day.  2. Magnesium oxide 400 mg twice a day for 10 days.  3. Potassium chloride 20 mEq p.o. daily for 10 days.   DIET: Regular diet, regular consistency.   ACTIVITY: Activity as tolerated.   FOLLOWUP: 1. Keep appointment with Dr. Marva Panda. 2. Follow-up this week with Dr. Maryellen Pile.   REASON FOR ADMISSION: The patient came in with palpitations on 03/11/2012.   HISTORY OF PRESENT ILLNESS: 19 year old man who was recently discharged from the hospital after coming in with nausea and vomiting, found to have a low potassium. Endoscopy showed erosive esophagitis and gastric ulcer, duodenal ulcer and was discharged home. He came back to the Emergency Room with palpitations, found to have a sinus tachycardia in the 140s- 150s, started on IV fluids, potassium replacement for potassium of 2.6. He was admitted as an observation.   LABORATORY, DIAGNOSTIC, AND RADIOLOGICAL DATA: EKG showed sinus tachycardia, flipped T waves laterally. Magnesium 1.3. TSH 2.27, glucose 144, BUN 20, creatinine 1.33, sodium 140, potassium 2.6, chloride 105, CO2 22, calcium 8.6. Liver function tests: Alkaline phosphatase low at 88, ALT 45, AST 30. White blood cell count 8.1, hemoglobin and hematocrit 14.9 and 43.3, platelet count 197. Urine toxicology negative. Urinalysis: 1+ ketones. Repeat labs upon discharge include a white count of 6.4, hemoglobin and hematocrit 13.1 and 38.3, platelet count 194. Glucose 116, BUN 8, creatinine 1.06, sodium 140, potassium 4.0, chloride 109, CO2 25, calcium 8.5 and  magnesium 1.6.   HOSPITAL COURSE PER PROBLEM LIST:  1. For the patient's sinus tachycardia and palpitations, with replacement of electrolytes and IV fluid hydration this had resolved. No further arrhythmias overnight. Heart rate upon discharge was ranging between 71 and 83.  2. Hypomagnesemia. This was replaced IV and orally. On the potassium of 1.6 upon discharge, another 2 grams IV was given prior to discharge and 400 mg orally twice a day for 10 days given.  3. For the patient's hypokalemia, this was replaced IV and orally and in IV fluids. Potassium upon discharge 4.0, 20 mEq p.o. daily for another 10 days given.  4. For the patient's peptic ulcer disease, the patient was kept on his Protonix. Last hospitalization on 09/02, he did have an endoscopy that showed erosive gastritis gastric ulcer with clean base, duodenal ulcer with clean base, erosive duodenitis. Keep follow-up appointment with Dr. Marva Panda.  5. For the patient's acute renal failure and dehydration, this improved with IV fluid hydration.   The patient was advised to follow-up closely with Dr. Maryellen Pile as an outpatient to recheck electrolytes as outpatient with follow-up appointment and when the electrolytes stop. I think part of the reason why this all happened, he was not eating very well upon going home. He was concerned about the bland diet and was eating frozen dinners. The electrolytes were replaced during the hospitalization and oral replacement upon discharge. The arrhythmia most likely is secondary to electrolyte abnormalities and dehydration. The patient improved, was asymptomatic upon discharge.          TIME SPENT ON DISCHARGE: 35 minutes.  ____________________________ Herschell Dimesichard J. Renae GlossWieting, MD rjw:ap D: 03/12/2012 14:21:28 ET T: 03/14/2012 13:21:37 ET JOB#: [id number]  cc: Herschell Dimesichard J. Renae GlossWieting, MD, <Dictator> Serita ShellerErnest B. Maryellen PileEason, MD Salley ScarletICHARD J Nesiah Jump MD ELECTRONICALLY SIGNED 03/15/2012 16:27

## 2014-10-23 NOTE — Discharge Summary (Signed)
PATIENT NAME:  Lonnie Carr, Lonnie Carr MR#:  [id number] DATE OF BIRTH:  09-20-1995  DATE OF ADMISSION:  03/06/2012 DATE OF DISCHARGE:  03/08/2012  For a detailed note, please take a look at the history and physical done on admission by Dr. Luberta MutterKonidena.   DIAGNOSES AT DISCHARGE:  1. Gastritis/peptic ulcer disease.  2. Nausea and vomiting secondary to the gastritis and peptic ulcer disease.  3. Acute renal failure, now resolved.  4. Hypokalemia, also now resolved.   DIET: The patient is being discharged on a regular diet.   ACTIVITY: As tolerated.   FOLLOW-UP: Follow-up with Dr. Maryellen PileEason in the next 1 to 2 weeks.   DISCHARGE MEDICATIONS: Protonix 40 mg b.i.d.   CONSULTANT DURING THE HOSPITAL COURSE: Dr. Barnetta ChapelMartin Skulskie from gastroenterology.   PERTINENT STUDIES DURING HOSPITAL COURSE: CT scan of the abdomen and pelvis done with contrast on 09/01 showed a normal exam with no evidence of any acute abdominal pathology. An upper GI endoscopy done on 03/07/2012 showed low grade erosive esophagitis. Erosive gastritis, gastric ulcer, nonbleeding with a clean base, which was biopsied, and one duodenal ulcer with a clean base.   HOSPITAL COURSE: This is an 19 year old male with no significant past medical history presented to the hospital on 03/06/2012 secondary to nausea, vomiting, and diarrhea.   1. Nausea, vomiting, and diarrhea. Initially the patient's nausea, vomiting, and diarrhea was thought to be related to colitis/mild ileitis as seen on the CT scan. Therefore, the patient initially was started on Ciprofloxacin and Flagyl and a GI consult was obtained. Upon further evaluation by GI, the patient did say that he had been taking a lot of ibuprofen for his headaches. The patient, therefore underwent an upper GI endoscopy which showed erosive gastritis and esophagitis along with a duodenal and peptic ulcer. This was likely the cause of the patient's nausea and vomiting, which has since then improved and  resolved. He is currently tolerating p.o. well with no further evidence of nausea or vomiting. He is being discharged on a proton pump inhibitor b.i.d. as stated.  2. Acute renal failure. This was likely secondary to the nausea and vomiting which has since then improved and resolved with IV fluids and as his symptoms have improved.  3. Hypokalemia. This was also secondary to the nausea and vomiting. This has since then been supplemented and also improved and resolved.   CODE STATUS: The patient is a FULL CODE.   TIME SPENT: Time spent on discharge was 40 minutes.    ____________________________ Rolly PancakeVivek J. Cherlynn KaiserSainani, MD vjs:rbg D: 03/08/2012 16:49:49 ET T: 03/10/2012 09:31:37 ET JOB#: [id number]  cc: Rolly PancakeVivek J. Cherlynn KaiserSainani, MD, <Dictator> Serita ShellerErnest B. Maryellen PileEason, MD Houston SirenVIVEK J SAINANI MD ELECTRONICALLY SIGNED 03/14/2012 13:23

## 2014-10-23 NOTE — H&P (Signed)
PATIENT NAME:  VESTAL, MARKIN MR#:  [id number] DATE OF BIRTH:  1996-04-04  DATE OF ADMISSION:  05/05/2012  REFERRING PHYSICIAN: Dr. Lucrezia Europe  PRIMARY CARE PHYSICIAN: Dr. Maryellen Pile  CHIEF COMPLAINT: Syncope.   HISTORY OF PRESENT ILLNESS: This is a 19 year old male with significant past medical history of recurrent hypokalemia with last admission in September 2013 for presentation of palpitations where patient was found to be tachycardic and having hypokalemia and hypomagnesemia. Patient had history as well where he had previous admission prior to this one where he presented with a similar presentation where he presented with vomiting where he was found to have peptic ulcer disease and gastritis on endoscopy. Patient presents today with syncope. Mother at bedside and she gave the history. Patient cannot recall any of this. Reports he went to bed, mother tried to call him multiple times where he did not answer so his older brother went to check on him where he was found on bed very lethargic, minimally responsive. Called EMS. Upon presentation patient was lethargic but responsive. Patient was found to have potassium of 2.1, magnesium of 1.4 where was replaced with p.o. and IV where patient thereafter started to become more responsive and back to his baseline. Patient denies any nausea or vomiting at this time, does not recall any of those events, only reports remembering waking up in ED and nothing prior to that. There was no reports of seizure-like activity, no urinary or stool incontinence or tongue biting. Patient as well reports he had 20 pound weight loss over the last few months, but reports this was unintentional even though he wanted to lose weight. Denies any vomiting during this episode. As well denies any binge eating, any induced vomiting.    ALLERGIES: No known drug allergies.   HOME MEDICATION: Protonix 40 mg p.o. daily.  PAST MEDICAL HISTORY: Gastritis and peptic ulcer disease diagnosed  on EGD which was diagnosed last month where he had EGD showing low grade erosive esophagitis, gastric ulcer and duodenal ulcer with clean base.   PAST SURGICAL HISTORY: None.   SOCIAL HISTORY: Patient just finished high school. No alcohol, tobacco, recreational drug use.   FAMILY HISTORY: Positive for diabetes, hypertension, and coronary artery disease.   REVIEW OF SYSTEMS: CONSTITUTIONAL: Patient denies fever. Complains of current fatigue and weakness. EYES: Denies blurry vision, double vision, or pain. ENT: Denies tinnitus, ear pain, hearing loss. RESPIRATORY: Denies cough, wheezing, hemoptysis, dyspnea. CARDIOVASCULAR: Denies chest pain, orthopnea, edema. Has complaints of palpitations, had syncope. GASTROINTESTINAL: Denies any nausea, vomiting, diarrhea, abdominal pain, hematemesis. GENITOURINARY: Denies dysuria, hematuria, or renal colic. ENDO: Denies polyuria, polydipsia, heat or cold intolerance. HEMATOLOGY: Denies anemia, easy bruising, bleeding diathesis. INTEGUMENTARY: Denies acne, rash, or skin lesion. MUSCULOSKELETAL: Denies any back pain, neck pain, shoulder pain, knee pain, hip pain, arthritis, or gout, or redness. NEUROLOGICAL: Denies numbness, weakness, dysarthria, epilepsy, tremors, vertigo. PSYCH: Denies any anxiety, insomnia, schizophrenia, nervousness, self-induced vomiting or bulimia or anorexia.   PHYSICAL EXAMINATION:  VITAL SIGNS: Temperature 97.7, pulse 103, respiratory rate 14, blood pressure 112/57, saturating 97% on room air.   GENERAL: Young male, looks comfortable in bed in no apparent distress.   HEENT: Head atraumatic, normocephalic. Pupils equal, reactive to light. Pink conjunctivae. Anicteric sclerae. Moist oral mucosa. Good indentation. No acid erosions signs on his teeth.   NECK: Supple. No thyromegaly. No JVD.   CHEST: Good air entry bilaterally. No wheezing, rales, rhonchi.   CARDIOVASCULAR: S1, S2 heard. No rubs, murmur, gallops. Tachycardic but regular  rhythm.   ABDOMEN: Soft, nontender, nondistended. Bowel sounds present.   EXTREMITIES: No edema. No clubbing. No cyanosis.   PSYCHIATRIC: Appropriate affect. Awake, alert x3. Intact judgment and insight. Appropriate affect. Does not appear to be depressed.   NEUROLOGIC: Cranial nerves grossly intact. Motor 5/5. Sensation symmetrical.   SKIN: Has no scratch mark on hands. No rash. No lesions.   LABORATORY, DIAGNOSTIC AND RADIOLOGICAL DATA: Glucose 154, BUN 13, creatinine 1.25, sodium 144, potassium 2.1, chloride 108, CO2 21, calcium 8.4, magnesium 1.4, white blood cells 17.9, hemoglobin 16, hematocrit 45.6, platelets 162. ABG showing pH 7.35, pCO2 38, pO2 103.   ASSESSMENT AND PLAN:  1. Syncope. This is most likely due to patient's hypokalemia. Will check echocardiogram. Will have patient admitted on medical floor with telemonitor. Will continue with fluid hydration. Will try to continue to correct hypokalemia and hypomagnesemia and will obtain troponin as it was not done in ED.  2. Hypokalemia. This is recurrent. At this time patient has no vomiting so unlikely induced by GI loss of potassium. Will consult nephrology service to evaluate for RTA syndrome. Will obtain urine electrolytes and will check renal ultrasound to evaluate for adrenal glands. Will obtain renin and aldosterone level and cortisol level. Will replace potassium and monitor level closely. Will repeat level at 10:00 a.m. and at 3:00 p.m.  3. Hypomagnesemia. Will replace and will recheck level in a.m.  4. Leukocytosis. This is most likely stress induced as patient is afebrile, has negative urinalysis. Will recheck CBC in a.m.  5. Recent history of gastric ulcer and gastritis. Will continue patient on Protonix p.o. b.i.d. Will avoid chemical anticoagulation.  6. Deep vein thrombosis prophylaxis. Patient is young and ambulatory, will encourage to ambulate and will have him on sequential compression device.  7. CODE STATUS: FULL  CODE.   TOTAL TIME SPENT ON ADMISSION AND PATIENT CARE: 55 minutes.   ____________________________ Starleen Armsawood S. Tierria Watson, MD dse:cms D: 05/05/2012 05:55:13 ET T: 05/05/2012 08:26:49 ET  JOB#: [id number] cc: Starleen Armsawood S. Ronnell Clinger, MD, <Dictator> Serita ShellerErnest B. Maryellen PileEason, MD Arielle Eber Teena IraniS Chason Mciver MD ELECTRONICALLY SIGNED 05/06/2012 2:50

## 2014-10-23 NOTE — Discharge Summary (Signed)
PATIENT NAME:  Lonnie Carr, Lonnie Carr MR#:  [id number] DATE OF BIRTH:  1996-06-28  DATE OF ADMISSION:  05/05/2012 DATE OF DISCHARGE:  05/06/2012  ADMITTING DIAGNOSIS: Syncope.   DISCHARGE DIAGNOSES:  1. Syncope possibly related to the patient's severe hypokalemia as well as hypomagnesemia. His echocardiogram and telemetry were nonrevealing.  2. Hypokalemia, hypomagnesemia of unclear etiology. Work-up currently being done. Seen by nephrology and endocrinology. His cortisol levels were normal. His 24-hour urine excretion of potassium was normal. He will follow-up Dr. Cherylann RatelLateef for further work-up.  3. Leukocytosis. This was stress induced and now normalized, no fevers.  4. History of gastric ulcer and gastritis. The patient was continued on proton pump inhibitors.  CONSULTANTS:  1. Wendall MolaMelissa Solum, MD. 2. [place] Cherylann RatelLateef, MD.  PERTINENT LABS AND EVALUATIONS: Admitting glucose 154, BUN 13, creatinine 1.25, sodium 144, potassium 2.1, chloride 108, CO2 21, calcium 8.4, and magnesium 1.4. LFTs were normal. CPK was 120. CK-MB was 0.8. Troponin was less than 0.5. TSH was 1.85. TUDs were negative. WBC count was 17.9, hemoglobin 16, and platelet count 162.   Urinalysis: Nitrites negative, leukocytes negative.   Random cortisol level was 15.9. Renin and aldosterone levels are pending.   His echocardiogram showed normal ejection fraction. No other abnormalities noted.   Potassium today is normal at 4.5. Magnesium is slightly low at 1.7. LFTs: Total protein 6.7, albumin 3.6, bilirubin total 0.2, and alkaline phosphatase 97.  PA and lateral chest x-ray was negative.  Renal ultrasound showed no focal abnormalities.   HOSPITAL COURSE: Please refer to history and physical done by the admitting physician. The patient is an 19 year old male with significant history of recurrent hypokalemia with recent admission for nausea and vomiting and was found to have gastritis. At that time, he was also noticed to have low  potassium and low magnesium. The patient at this time was admitted for possible syncopal episode. He was noted to be a little tachycardic on presentation with sinus tachycardia. His potassium was significantly low. His magnesium was low as well. He was given supplements and his magnesium and potassium normalized. He was seen in consultation by endocrinology and nephrology. His 24-hour urine collection for potassium was normal, which means that he was not losing potassium through his urine. It is unclear what the cause of his symptoms are. At this point, he will be following up with Dr. Mady HaagensenMunsoor Lateef next Tuesday. He is doing much better and is stable for discharge.   DISCHARGE MEDICATIONS:  1. Protonix 40 mg 1 tab p.o. twice a day. 2. Zofran 4 mg every eight hours p.r.n.  3. KCl 20 milliequivalents 1 tab p.o. twice a day. 4. Mag oxide 40 mg 1 tab p.o. twice a day.  DIET: Regular.   ACTIVITY: As tolerated.   DISCHARGE FOLLOWUP: Follow with Dr. Maryellen PileEason in 1 to 2 weeks and Dr. Cherylann RatelLateef next Tuesday.  TIME SPENT: 35 minutes.  ____________________________ Lacie ScottsShreyang H. Allena KatzPatel, MD shp:slb D: 05/06/2012 16:32:44 ET     T: 05/07/2012 11:52:42 ET        JOB#: [id number] cc: Toneka Fullen H. Allena KatzPatel, MD, <Dictator> Serita ShellerErnest B. Maryellen PileEason, MD Charise CarwinSHREYANG H Toria Monte MD ELECTRONICALLY SIGNED 05/10/2012 17:28

## 2014-10-23 NOTE — H&P (Signed)
PATIENT NAME:  Carr, Lonnie MR#:  [id number] DATE OF BIRTH:  [birth year]  DATE OF ADMISSION:  03/06/2012  PRIMARY CARE PHYSICIAN: None local.  ER PHYSICIAN: Chiquita Loth, MD  CHIEF COMPLAINT: Intractable nausea, vomiting, and diarrhea.   HISTORY OF PRESENT ILLNESS: This is an 19 year old male with no past medical history who came in because of nausea an vomiting going on for the last two weeks. Today he had diarrhea two times and around evening the patient started to have palpitations so they called EMS. When EMS arrived, the patient was found to be tachycardic with heart rate of 140s so he was brought in by the ambulance. The patient actually was here on 02/23/2012 for the same complaint and was given Phenergan and Zofran and was discharged home, but he did not get better and unable to keep anything down with intractable nausea, vomiting, and diarrhea today. The patient also had some abdominal pain. No blood in the stool, but has some episodes of blood in the vomiting because of persistent vomiting. The patient did not have any fever or cough, but complains of generalized body aches. He was found to have potassium of 2.4 in the emergency room. We were asked to admit the patient for acute renal failure, hypokalemia, and ileitis on the CAT scan.  PAST MEDICAL HISTORY: No hypertension or diabetes. No known medical problems.   ALLERGIES: No known drug allergies.   SOCIAL HISTORY: No smoking. No drinking. He just finished high school.  FAMILY HISTORY: Significant for father has hypertension.  MEDICATIONS: None.  PAST SURGICAL HISTORY: No surgery.  REVIEW OF SYSTEMS: CONSTITUTIONAL: Feeling better after potassium infusion, but still feels very weak. EYES: No blurred vision. ENT: No tinnitus. No ear pain. RESPIRATORY: No cough. No wheezing. CARDIOVASCULAR: No chest pain. Had palpitations earlier today. GI: Has had nausea and vomiting for the two weeks. Diarrhea just started today. Also some abdominal  pain which is generalized with no radiation to the back. The patient has some diarrhea today but no rectal bleeding. GENITOURINARY: No dysuria. ENDOCRINE: No polyuria or nocturia. INTEGUMENTARY: No skin rashes. MUSCULOSKELETAL: No joint pain. NEUROLOGIC: No numbness or weakness. PSYCH: No anxiety or insomnia.   PHYSICAL EXAMINATION:   VITAL SIGNS: Temperature 99.1, heart rate 140 initially, respiration 28, blood pressure 140/61, and saturation 97% on room air. Right now repeat heart rate is 98. '  GENERAL: He is alert, awake, and oriented, not in distress, answering questions appropriately.   HEAD/EYES: Head atraumatic, normocephalic. Pupils equally reacting to light. Extraocular movements are intact.   ENT: No tympanic membrane congestion. No turbinate hypertrophy. No oropharyngeal erythema.   NECK: Normal range of motion. No meningeal signs. No neck stiffness.  No JVD. No carotid bruit. No lymphadenopathy.  RESPIRATORY: Clear to auscultation. No wheeze. No rales.   CARDIOVASCULAR: S1 and S2 regular, slightly tachycardic. No murmurs. PMI not displaced.   ABDOMEN: Soft. Mild generalized tenderness present. No rebound tenderness. Bowel sounds are present. No hernia.   MUSCULOSKELETAL: Strength 5/5 in upper and lower extremities.   SKIN: No skin rashes.   LYMPH NODES: No lymphadenopathy in cervical or axillary region.   NEUROLOGIC: Cranial nerves II through XII grossly intact. DTRs 2+ bilaterally.   PSYCH: Oriented to time, place, and person.  LABORATORY, DIAGNOSTIC AND RADIOLOGIC DATA: Urine toxicology is negative.   Urinalysis is clear.   WBC 15.1, hemoglobin 13.3, hematocrit 38.3, and platelets 184.   Electrolytes:  Sodium 142, potassium 2.4, chloride 107, bicarbonate 25, BUN 16, creatinine  1.42, and glucose 175. Lipase 114.   CT of the abdomen shows free fluid in the right pelvis with some adjacent small bowel loops present would could reflect ileitis due to infection,  non-infection, inflammatory, or vascular etiology.  The patient has normal appendix. No evidence of  periappendeceal inflmmation and no evidnece of a appendicitis.EKG: Sinus tach at 143 beats per minute with no ST-T changes. This was on admission.   ASSESSMENT AND PLAN:  1. An 19 year old male with intractable nausea and vomiting with dehydration and evidence of hypokalemia and acute renal failure secondary to nausea, vomiting, and diarrhea. Admit the patient to the hospitalist service. The patient has already received about 2 liters of fluid in the ER because of sinus bradycardia. The patient's heart rate is better. He is going to be admitted to the hospitalist service on telemetry. Continue IV fluids along with potassium. The patient has hypokalemia. He already received D5 with 40 milliequivalents of KCL at 250/hr. He feels much better so we are going to continue normal saline with potassium for his hypokalemia and acute renal failure and see how his BMP is tomorrow.  2. For ileitis, the patient is on antibiotics with Cipro and Flagyl. We will get a gastroenterology consult for possible evaluation of ulcerative colitis or Crohn's disease.            The patient's mother was explained about all the findings and answered all her questions. The patient's condition is stable.   TIME SPENT ON HISTORY AND PHYSICAL: About 60 minutes. ____________________________ Katha HammingSnehalatha Ayaansh Smail, MD sk:slb D: 03/06/2012 04:51:48 ET T: 03/06/2012 12:46:01 ET JOB#: [id number]  cc: Katha HammingSnehalatha Wing Gfeller, MD, <Dictator> Katha HammingSNEHALATHA Shiya Fogelman MD ELECTRONICALLY SIGNED 04/05/2012 21:46

## 2014-10-23 NOTE — Consult Note (Signed)
PATIENT NAME:  Lonnie Carr, Lonnie Carr MR#:  [id number] DATE OF BIRTH:  [birth year]  DATE OF CONSULTATION:  05/05/2012  REFERRING PHYSICIAN:  Huey Bienenstockawood Elgergawy, MD  CONSULTING PHYSICIAN:  Jaretssi Kraker Lizabeth LeydenN. Shron Ozer, MD  REASON FOR CONSULTATION: Recurrent hypokalemia and hypomagnesemia.   HISTORY OF PRESENT ILLNESS: The patient is a pleasant 19 year old Hispanic male with past medical history of erosive gastritis, possible ileitis, recurrent hypokalemia and hypomagnesemia who presented to [facility] with a chief complaint of possible syncope and palpitations. The patient appears to have a rather complex recent past medical history. The patient has been recently admitted twice for palpitations thought to be secondary to recurrent hypokalemia and hypomagnesemia. The patient has responded to repletion efforts in the past. When he presented in late August, serum potassium was quite low at 2.4. There has also been a suggestion of intermittent alkalosis as serum bicarbonate has been as high as 27. However, that was during a period of time when the patient had active nausea and vomiting. When he presented in late August/early September he was having active nausea and vomiting at that time. However, upon this admission the patient was not having any nausea or vomiting. The patient reports that he has felt quite exhausted over the past several days as his brother recently got married. The patient reports that he slept most of Wednesday. The patient's mother was worried about him yesterday. However, the patient's mother went to church for a short period of time and when she called her son at home he did not answer. Another son was asked to check up on him. When the patient's brother went to his home, the patient was difficult to arouse. The patient's brother then called EMS. The patient was eventually able to be aroused and did walk onto the stretcher which was in the living room as the stretcher could not get into  his bedroom. The patient's mother relates to me that he was tachycardic when EMS arrived with heart rate as high as the 170's. Upon presentation here the patient's heart rate was 133. It has subsequently come down. I spent a significant amount of time with the patient and his mother today. The patient denies at this point in time taking any diuretics at home. His urine drug screen has also been found to be negative. His TSH is actually found to be normal at 1.85. The patient has also had some periods of elevated serum sodium of 146. The only medication he is taking at home is Protonix. Previously he was taking ibuprofen for migraine headaches but he has not taken these at least 4 to 5 weeks now. The patient has endorsed generalized fatigue over the past several days. When he arrived this time, serum potassium was very low at 2.1 and magnesium was also quite low at 1.4. He is actively receiving repletion now and his serum potassium is 3.7 at present.   PAST MEDICAL HISTORY:  1. 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  2. Recurrent hypokalemia and hypomagnesemia.  3. History of palpitations attributable to hypokalemia and hypomagnesemia.   PAST SURGICAL HISTORY: None.   SOCIAL HISTORY: The patient was home schooled. He has graduated. He denies tobacco use. He also denies any alcohol or illicit drug use.   FAMILY HISTORY: No reported family history of Barger syndrome or Gitelman. Family history, however, is positive for diabetes and hypertension.   REVIEW OF SYSTEMS: CONSTITUTIONAL: The patient has had some weight loss since he has had this recent issue with  hypokalemia and hypomagnesemia. EYES: Denies diplopia or blurry vision. HEENT: Denies hearing loss. Denies epistaxis. Has had history of some headaches for which he previously took ibuprofen but is no longer taking this. CARDIOVASCULAR: Denies chest pains. Does report palpitations recently. RESPIRATORY: Denies cough, shortness of breath, or  hemoptysis. GI: Previously had episodes of nausea and vomiting but this hospitalization he denies having any nausea or vomiting. GU: Denies frequency or urgency. Denies polyuria at this time. MUSCULOSKELETAL: Denies joint pain, swelling, or redness. INTEGUMENTARY: Denies skin rashes or lesions. NEUROLOGIC: Reports generalized weakness but no focal weakness. He has also had some muscular cramps. PSYCHIATRIC: Denies depression or bipolar disorder. ENDOCRINE: Denies polydipsia. Also denies polyuria. HEMATOLOGIC: Denies easy bruisability, bleeding, or swollen lymph nodes. ALLERGY/IMMUNOLOGIC: Denies seasonal allergies or history of immunodeficiency.   PHYSICAL EXAMINATION:   VITAL SIGNS: Upon admission pulse was 128, respirations were 18, blood pressure was 116/56. Current vitals temperature 98, pulse 91, respirations 18, blood pressure 133/83.   GENERAL: Well developed, well nourished Hispanic male who appears his stated age currently in no acute distress.   HEENT: Normocephalic, atraumatic. Extraocular movements are intact. Pupils are equal, round, and reactive to light. No scleral icterus. Conjunctivae are pink. No epistaxis noted. Gross hearing intact. Oral mucosa moist.   NECK: Supple without JVD or lymphadenopathy.   LUNGS: Clear to auscultation bilaterally with normal respiratory effort.   CARDIOVASCULAR: S1, S2. The patient currently is found to have regular rate and rhythm. No obvious murmurs or rubs appreciated.   ABDOMEN: Soft, nontender, nondistended. Bowel sounds positive. No rebound or guarding. No gross organomegaly appreciated.   EXTREMITIES: No clubbing, cyanosis, or edema.   NEUROLOGIC: The patient is alert and oriented to time, person, and place. Strength is 5 out of 5 in both upper and lower extremities.   GU: No suprapubic tenderness is noted at this time.   MUSCULOSKELETAL: No joint redness, swelling, or tenderness appreciated.   SKIN: Warm and dry. No rashes noted    PSYCHIATRIC: The patient has an appropriate affect and appears to have good insight into his current illness.   LABORATORY DATA: Sodium 144, potassium 3.7, chloride 110, CO2 25, BUN 9, creatinine 1.05, glucose 122, serum calcium 8.5. Hemoglobin A1c 5.2. LFTs are unremarkable. Troponin is 0.05. Urine drug screen is negative. TSH is 1.85. CBC shows WBC 17.9, hemoglobin 16, hematocrit 45, platelets 162. Urinalysis shows specific gravity of 1.013, pH 6, 1 RBC per high-power field. ABG shows pH 7.35, pCO2 38, pO2 103, FiO2 21%.   Labs upon admission showed a sodium of 144, potassium 2.1, chloride 108, CO2 21, BUN 13, creatinine 1.25, glucose 154. Magnesium of 1.4.   IMPRESSION: This is an 19 year old Hispanic male with a past medical history of recent erosive gastritis likely due to NSAID use and recurrent nausea and vomiting, recurrent hypokalemia and hypomagnesemia, and periods of elevated serum bicarbonate level.   PROBLEM LIST:  1. Recurrent hypokalemia.  2. Recurrent hypomagnesemia.  3. Palpitations secondary to #1 and #2.  4. Episodes of elevated serum bicarbonate.   PLAN: The patient presents with a very interesting case. This is his third admission for recurrent hypokalemia and hypomagnesemia. At present there does not appear to be anything in the history that would suggest occult ingestion of diuretics. He had quite a low serum potassium upon admission at 2.1. He was not having nausea, vomiting, or diarrhea upon this admission which could potentially explain any GI losses of potassium. The patient has also had some periods of  elevated serum bicarbonate. 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 Gitelman can often present in late childhood and early adulthood. However, this would be a diagnosis of exclusion. I agree with current potassium repletion efforts. He is currently receiving  potassium chloride 20 mEq p.o. q.4 hours. He is also receiving normal saline supplemented with 20 mEq of potassium at 100 mL/h. We would continue this as serum potassium is improving. We will obtain further testing including spot urine potassium, 24-hour urine for potassium, as well as spot urine calcium. With Gitelman syndrome, we would expect low calcium excretion. In addition, we will obtain consultation with Dr. Tedd Sias to exclude any underlying endocrine disturbance which could potentially account for these findings. We will also obtain diuretic screen.   I would like to thank Dr. Randol Kern for this kind referral. Further plan as the patient progresses.   ____________________________ Lennox Pippins, MD mnl:drc D: 05/05/2012 13:05:32 ET T: 05/05/2012 13:25:16 ET JOB#: [id number]  cc: Lennox Pippins, MD, <Dictator> Lennox Pippins MD ELECTRONICALLY SIGNED 05/17/2012 10:46

## 2014-10-23 NOTE — H&P (Signed)
PATIENT NAME:  Lonnie Carr, Lonnie Carr MR#:  [id number] DATE OF BIRTH:  1995-07-20  DATE OF ADMISSION:  03/11/2012  PRIMARY CARE PHYSICIAN: Dr. Maryellen Pile   ER PHYSICIAN: Dr. Lavella Lemons   ADMITTING PHYSICIAN: Dr. Tilda Franco    PRESENTING COMPLAINT: Palpitations.   HISTORY OF PRESENT ILLNESS: The patient is an 19 year old man who was recently discharged from the hospital about four days ago following admission for nausea and vomiting during which he had low potassium with persistent vomitus. He had EGD which showed low-grade erosive esophagitis, gastric ulcer and duodenal ulcer with clean bases. He was discharged home and has been stable since then. He is able to tolerate food. Has had no further nausea or vomiting but this morning woke up with severe palpitations with some chest tightness. Denies any recent change in medication. No long distance travel. No sick contacts. No recreational drug use. For this he presented to the Emergency Room after symptoms did not resolve. Here he was noted to have heart rate in the 140's and 150's with sinus tachycardia, started on IV fluids, potassium replacement as his potassium was low at 2.6, and referred to hospitalist. The patient denies any dysuria. No fever. No loss of consciousness. No PND, orthopnea, or pedal edema. Of note, about six weeks ago he traveled down to [place] with the family but no family history of similar illness, nausea, vomiting, or diarrhea.   REVIEW OF SYSTEMS: CONSTITUTIONAL: Positive for fatigue but no fever. No weight loss or weight gain. EYES: No blurred vision, redness, discharge. ENT: No tinnitus, epistaxis, or difficulty swallowing. RESPIRATORY: No cough or shortness of breath. CARDIOVASCULAR: No chest pain, orthopnea, or pedal edema. No syncopal episode. Only positive for palpitations. GI: Admits of some nausea here in the ER but no vomiting or diarrhea. No abdominal pain. No change in bowel habits. GU: No dysuria, frequency, or incontinence. ENDOCRINE: No  polyuria, polydipsia, heat or cold intolerance. HEMATOLOGIC: No anemia, easy bruising, bleeding, or swollen glands. SKIN: No rashes, change in hair or skin texture. MUSCULOSKELETAL: No joint pain, redness, or swelling. NEUROLOGIC: No numbness, tremors, vertigo, seizures, or memory loss. PSYCH: No anxiety or depression.   PAST MEDICAL HISTORY: Recent gastritis, peptic ulcer disease diagnosed from EGD done four days ago which showed low-grade erosive esophagitis, gastric ulcer, and duodenal ulcer with clean base.   PAST SURGICAL HISTORY: None.   SOCIAL HISTORY: Just finished high school. No alcohol, tobacco, or recreational drug use.   FAMILY HISTORY: Positive for diabetes, high blood pressure, and coronary artery disease in first degree relatives.   ALLERGIES: No known drug allergies.   MEDICATIONS: Protonix 40 mg p.o. twice daily.   PHYSICAL EXAMINATION:   VITAL SIGNS: Temperature 98.4, pulse 128, respiratory rate 20, blood pressure 133/63, sats of 97 on room air.   GENERAL: Young male lying on the gurney awake, alert, oriented to time, place, and person in no distress. Family at bedside, supportive.   HEENT: Atraumatic, normocephalic. Pupils equal, reactive to light and accommodation. Extraocular movement intact. Mucous membranes pink, moist.   NECK: Supple. No JV distention.   CHEST: Clear to auscultation.   HEART: Regular rate and rhythm. No murmur.   ABDOMEN: Full, moves with respiration, nontender. Bowel sounds normoactive. No organomegaly.   EXTREMITIES: No edema, clubbing, deformity.   NEUROLOGICAL: No focal deficits.   PSYCH: Affect appropriate to situation.   LABORATORY, DIAGNOSTIC, AND RADIOLOGICAL DATA: EKG shows sinus tachycardia, rate of 134 with T wave inversion in lateral leads.   CBC white count  8, hemoglobin 14, platelets 197. Chemistry unremarkable except for low potassium of 2.6 down from 3.5 from four days ago and creatinine of 1.3 down from 1 from four days  ago. BUN 20, glucose 144, calcium 8.6, anion gap 13, bicarb 22. D-dimer is negative at 0.37. Normal LFTs.   Records show EGD from four days ago which showed low-grade erosive esophagitis, gastric and duodenal ulcers with clean bases.   IMPRESSION:  1. Cardiac arrhythmia likely secondary to sinus tachycardia secondary to hypokalemia. 2. Hypokalemia, likely due to low magnesium and recent  volume depletion with nausea and vomiting.   3. Peptic ulcer disease, on Protonix.  4. Acute kidney injury, likely from volume depletion.   PLAN:  1. Admit to general medical floor under observation. 2. Serial cardiac enzymes, TSH, magnesium.  3. Repeat EKG when heart rate is more stable.  4. Check orthostatics.  5. Replace magnesium 1 gram IV stat.  6. Replete potassium.  7. GI prophylaxis with Protonix.  8. DVT prophylaxis with Lovenox.   CODE STATUS: FULL CODE.   TOTAL PATIENT CARE: 50 minutes.   ____________________________ Floy SabinaMarcel I. Tilda FrancoAkuneme, MD mia:drc D: 03/11/2012 07:36:54 ET T: 03/11/2012 08:25:51 ET JOB#: [id number]  cc: Saidah Kempton I. Tilda FrancoAkuneme, MD, <Dictator>, Serita ShellerErnest B. Maryellen PileEason, MD Margaret PyleMARCEL I Axyl Sitzman MD ELECTRONICALLY SIGNED 03/12/2012 2:27

## 2015-10-09 ENCOUNTER — Encounter: Payer: Self-pay | Admitting: Emergency Medicine

## 2015-10-09 ENCOUNTER — Emergency Department
Admission: EM | Admit: 2015-10-09 | Discharge: 2015-10-09 | Disposition: A | Discharge status: Home / Self Care | Payer: 59 | Attending: Emergency Medicine | Admitting: Emergency Medicine

## 2015-10-09 DIAGNOSIS — E876 Hypokalemia: Secondary | ICD-10-CM | POA: Diagnosis not present

## 2015-10-09 DIAGNOSIS — B349 Viral infection, unspecified: Secondary | ICD-10-CM | POA: Diagnosis not present

## 2015-10-09 DIAGNOSIS — R509 Fever, unspecified: Secondary | ICD-10-CM | POA: Diagnosis present

## 2015-10-09 DIAGNOSIS — Z0189 Encounter for other specified special examinations: Secondary | ICD-10-CM

## 2015-10-09 DIAGNOSIS — Z79899 Other long term (current) drug therapy: Secondary | ICD-10-CM | POA: Insufficient documentation

## 2015-10-09 LAB — CBC WITH DIFFERENTIAL/PLATELET
Basophils Absolute: 0 10*3/uL (ref 0–0.1)
Basophils Relative: 0 %
EOS PCT: 0 %
Eosinophils Absolute: 0 10*3/uL (ref 0–0.7)
HCT: 42.1 % (ref 40.0–52.0)
Hemoglobin: 14.4 g/dL (ref 13.0–18.0)
LYMPHS ABS: 0.8 10*3/uL — AB (ref 1.0–3.6)
LYMPHS PCT: 4 %
MCH: 28.8 pg (ref 26.0–34.0)
MCHC: 34.2 g/dL (ref 32.0–36.0)
MCV: 84.2 fL (ref 80.0–100.0)
MONO ABS: 1.5 10*3/uL — AB (ref 0.2–1.0)
Monocytes Relative: 8 %
Neutro Abs: 17.6 10*3/uL — ABNORMAL HIGH (ref 1.4–6.5)
Neutrophils Relative %: 88 %
PLATELETS: 165 10*3/uL (ref 150–440)
RBC: 5 MIL/uL (ref 4.40–5.90)
RDW: 12.2 % (ref 11.5–14.5)
WBC: 19.9 10*3/uL — AB (ref 3.8–10.6)

## 2015-10-09 LAB — PROTEIN AND GLUCOSE, CSF
Glucose, CSF: 75 mg/dL — ABNORMAL HIGH (ref 40–70)
TOTAL PROTEIN, CSF: 21 mg/dL (ref 15–45)

## 2015-10-09 LAB — CSF CELL COUNT WITH DIFFERENTIAL
EOS CSF: 0 %
Eosinophils, CSF: 0 %
LYMPHS CSF: 37 %
LYMPHS CSF: 55 %
MONOCYTE-MACROPHAGE-SPINAL FLUID: 16 %
MONOCYTE-MACROPHAGE-SPINAL FLUID: 26 %
Other Cells, CSF: 0
Other Cells, CSF: 0
RBC COUNT CSF: 172 /mm3 — AB (ref 0–3)
RBC Count, CSF: 81 /mm3 — ABNORMAL HIGH (ref 0–3)
SEGMENTED NEUTROPHILS-CSF: 29 %
SEGMENTED NEUTROPHILS-CSF: 37 %
Tube #: 1
Tube #: 4
WBC CSF: 5 /mm3
WBC CSF: 5 /mm3

## 2015-10-09 LAB — BASIC METABOLIC PANEL
Anion gap: 7 (ref 5–15)
BUN: 18 mg/dL (ref 6–20)
CALCIUM: 9 mg/dL (ref 8.9–10.3)
CO2: 22 mmol/L (ref 22–32)
Chloride: 106 mmol/L (ref 101–111)
Creatinine, Ser: 1.3 mg/dL — ABNORMAL HIGH (ref 0.61–1.24)
GFR calc Af Amer: >60 mL/min (ref >60)
GFR calc non Af Amer: >60 mL/min (ref >60)
GLUCOSE: 130 mg/dL — AB (ref 65–99)
POTASSIUM: 3.3 mmol/L — AB (ref 3.5–5.1)
Sodium: 135 mmol/L (ref 135–145)

## 2015-10-09 LAB — RAPID INFLUENZA A&B ANTIGENS
Influenza A (ARMC): NEGATIVE
Influenza B (ARMC): NEGATIVE

## 2015-10-09 LAB — RAPID HIV SCREEN (HIV 1/2 AB+AG)
HIV 1/2 Antibodies: NONREACTIVE
HIV-1 P24 Antigen - HIV24: NONREACTIVE

## 2015-10-09 LAB — POCT RAPID STREP A: Streptococcus, Group A Screen (Direct): NEGATIVE

## 2015-10-09 LAB — CRYPTOCOCCAL ANTIGEN, CSF: CRYPTO AG: NEGATIVE

## 2015-10-09 LAB — MONONUCLEOSIS SCREEN: Mono Screen: NEGATIVE

## 2015-10-09 MED ORDER — LIDOCAINE HCL (PF) 1 % IJ SOLN
INTRAMUSCULAR | Status: AC
  Administered 2015-10-09: 5 mL
  Filled 2015-10-09: qty 10

## 2015-10-09 MED ORDER — DEXAMETHASONE SODIUM PHOSPHATE 10 MG/ML IJ SOLN
10.0000 mg | Freq: Once | INTRAMUSCULAR | Status: AC
  Administered 2015-10-09: 10 mg via INTRAVENOUS
  Filled 2015-10-09: qty 1

## 2015-10-09 MED ORDER — KETOROLAC TROMETHAMINE 30 MG/ML IJ SOLN
30.0000 mg | Freq: Once | INTRAMUSCULAR | Status: AC
  Administered 2015-10-09: 30 mg via INTRAVENOUS
  Filled 2015-10-09: qty 1

## 2015-10-09 MED ORDER — SODIUM CHLORIDE 0.9 % IV SOLN
Freq: Once | INTRAVENOUS | Status: AC
  Administered 2015-10-09: 07:00:00 via INTRAVENOUS

## 2015-10-09 MED ORDER — LORAZEPAM 2 MG/ML IJ SOLN
1.0000 mg | Freq: Once | INTRAMUSCULAR | Status: AC
  Administered 2015-10-09: 1 mg via INTRAVENOUS
  Filled 2015-10-09: qty 1

## 2015-10-09 MED ORDER — PREDNISONE 10 MG (21) PO TBPK: ORAL_TABLET | ORAL | Status: AC

## 2015-10-09 NOTE — ED Provider Notes (Signed)
[facility] Emergency Department Provider Note     Time seen: ----------------------------------------- 7:29 AM on 10/09/2015 -----------------------------------------    I have reviewed the triage vital signs and the nursing notes.   HISTORY  Chief Complaint Fever    HPI Lonnie Carr is a 20 y.o. male who presents to the ER for high fever. Patient states 4 AM he took 800 mg ibuprofen, he's having neck and back soreness and feeling dizzy, also having sore throat. Patient states fever went up to 104 earlier. He denies any cough congestion, vomiting or diarrhea. Patient has not had a history of this.   Past Medical History  Diagnosis Date  . Hypokalemia   . Hypomagnesemia   . Gastritis   . Peptic ulcer disease   . GERD (gastroesophageal reflux disease)   . Syncope and collapse   . Tachycardia     Patient Active Problem List   Diagnosis Date Noted  . Hypokalemia 01/19/2013  . Tachycardia     History reviewed. No pertinent past surgical history.  Allergies Review of patient's allergies indicates no known allergies.  Social History Social History  Substance Use Topics  . Smoking status: Never Smoker   . Smokeless tobacco: None  . Alcohol Use: No    Review of Systems Constitutional: Positive for fever and body aches Eyes: Negative for visual changes. ENT: Positive for sore throat Cardiovascular: Negative for chest pain. Respiratory: Negative for shortness of breath. Gastrointestinal: Negative for abdominal pain, vomiting and diarrhea. Genitourinary: Negative for dysuria. Musculoskeletal: Positive for neck and back pain Skin: Negative for rash. Neurological: Positive for headache and weakness  10-point ROS otherwise negative.  ____________________________________________   PHYSICAL EXAM:  VITAL SIGNS: ED Triage Vitals  Enc Vitals Group     BP 10/09/15 0631 138/82 mmHg     Pulse Rate 10/09/15 0631 144     Resp 10/09/15  0631 24     Temp 10/09/15 0631 99.7 F (37.6 C)     Temp Source 10/09/15 0631 Oral     SpO2 10/09/15 0631 97 %     Weight 10/09/15 0631 180 lb (81.647 kg)     Height 10/09/15 0631 5\' 8"  (1.727 m)     Head Cir --      Peak Flow --      Pain Score 10/09/15 0632 7     Pain Loc --      Pain Edu? --      Excl. in GC? --     Constitutional: Alert and oriented. Well appearing and in no distress. Eyes: Conjunctivae are normal. PERRL. Normal extraocular movements. ENT   Head: Normocephalic and atraumatic.   Nose: No congestion/rhinnorhea.   Mouth/Throat: Mild pharyngeal erythema   Neck: No stridor. Cardiovascular: Rapid rate, regular rhythm. Normal and symmetric distal pulses are present in all extremities. No murmurs, rubs, or gallops. Respiratory: Normal respiratory effort without tachypnea nor retractions. Breath sounds are clear and equal bilaterally. No wheezes/rales/rhonchi. Gastrointestinal: Soft and nontender. No distention. No abdominal bruits.  Musculoskeletal: Nontender with normal range of motion in all extremities. No joint effusions.  No lower extremity tenderness nor edema. Neurologic:  Normal speech and language. No gross focal neurologic deficits are appreciated. Speech is normal. No gait instability. Skin:  Skin is warm, dry and intact. No rash noted. Psychiatric: Mood and affect are normal. Speech and behavior are normal. Patient exhibits appropriate insight and judgment. ____________________________________________  EKG: Interpreted by me. Sinus tachycardia with a rate of 143 bpm,  normal PR interval, normal QRS, long QT interval. Normal axis.  ____________________________________________  ED COURSE:  Pertinent labs & imaging results that were available during my care of the patient were reviewed by me and considered in my medical decision making (see chart for details). Patient does not appear to be any acute distress, has flulike symptoms. We'll check  basic labs, give IV fluid and reevaluate. ____________________________________________    LABS (pertinent positives/negatives)  Labs Reviewed  CBC WITH DIFFERENTIAL/PLATELET - Abnormal; Notable for the following:    WBC 19.9 (*)    Neutro Abs 17.6 (*)    Lymphs Abs 0.8 (*)    Monocytes Absolute 1.5 (*)    All other components within normal limits  BASIC METABOLIC PANEL - Abnormal; Notable for the following:    Potassium 3.3 (*)    Glucose, Bld 130 (*)    Creatinine, Ser 1.30 (*)    All other components within normal limits  RAPID INFLUENZA A&B ANTIGENS ([facility] ONLY)  CSF CULTURE  GRAM STAIN  CULTURE, BLOOD (ROUTINE X 2)  CULTURE, BLOOD (ROUTINE X 2)  MONONUCLEOSIS SCREEN  CSF CELL COUNT WITH DIFFERENTIAL  CSF CELL COUNT WITH DIFFERENTIAL  PROTEIN AND GLUCOSE, CSF  HIV ANTIBODY (ROUTINE TESTING)  CRYPTOCOCCAL ANTIGEN, CSF  HERPES SIMPLEX VIRUS(HSV) DNA BY PCR  RAPID HIV SCREEN (HIV 1/2 AB+AG)  POCT RAPID STREP A   LUMBAR PUNCTURE  Date/Time: 10/09/2015 at 9:45 AM Performed by: Daryel November E  Consent: Verbal consent obtained. Written consent obtained. Risks and benefits: risks, benefits and alternatives were discussed Consent given by: Patient Patient understanding: patient states understanding of the procedure being performed  Patient consent: the patient's understanding of the procedure matches consent given  Procedure consent: procedure consent matches procedure scheduled  Relevant documents: relevant documents present and verified  Test results: test results available and properly labeled Site marked: the operative site was marked Imaging studies: imaging studies available  Required items: required blood products, implants, devices, and special equipment available  Patient identity confirmed: verbally with patient and arm band  Time out: Immediately prior to procedure a "time out" was called to verify the correct patient, procedure, equipment, support staff  and site/side marked as required.  Indications: Fever headache, neck pain  Anesthesia: local infiltration Local anesthetic: lidocaine 1% without epinephrine Anesthetic total: 5 ml Patient sedated: No  Analgesia: Ativan  Preparation: Patient was prepped and draped in the usual sterile fashion. Lumbar space: L3-L4 interspace Patient's position: Seated position  Needle gauge: 22 Needle length: 3.5 in Number of attempts: 1 Fluid appearance: Clear Tubes of fluid: 4 Total volume: 5 ml Post-procedure: site cleaned and adhesive bandage applied Patient tolerance: Patient tolerated the procedure well with no immediate complications  ____________________________________________  FINAL ASSESSMENT AND PLAN  Fever, Leukocytosis, lumbar puncture  Plan: Patient with labs and imaging as dictated above. Patient currently is feeling better, this is likely a viral etiology. Current heart rate is 107, blood pressures 128/103. 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 His spinal fluid is not consistent with meningitis, he'll be discharged with steroids for sore throat and encouraged to continue taking ibuprofen. He is stable for outpatient follow-up.   Emily Filbert, MD   Emily Filbert, MD 10/09/15 [phone]

## 2015-10-09 NOTE — ED Notes (Signed)
With dr Mayford Knifewilliams at bedside for lumbar puncture. Pt a/o, vss.

## 2015-10-09 NOTE — Discharge Instructions (Signed)
Viral Infections °A viral infection can be caused by different types of viruses. Most viral infections are not serious and resolve on their own. However, some infections may cause severe symptoms and may lead to further complications. °SYMPTOMS °Viruses can frequently cause: °· Minor sore throat. °· Aches and pains. °· Headaches. °· Runny nose. °· Different types of rashes. °· Watery eyes. °· Tiredness. °· Cough. °· Loss of appetite. °· Gastrointestinal infections, resulting in nausea, vomiting, and diarrhea. °These symptoms do not respond to antibiotics because the infection is not caused by bacteria. However, you might catch a bacterial infection following the viral infection. This is sometimes called a "superinfection." Symptoms of such a bacterial infection may include: °· Worsening sore throat with pus and difficulty swallowing. °· Swollen neck glands. °· Chills and a high or persistent fever. °· Severe headache. °· Tenderness over the sinuses. °· Persistent overall ill feeling (malaise), muscle aches, and tiredness (fatigue). °· Persistent cough. °· Yellow, green, or brown mucus production with coughing. °HOME CARE INSTRUCTIONS  °· Only take over-the-counter or prescription medicines for pain, discomfort, diarrhea, or fever as directed by your caregiver. °· Drink enough water and fluids to keep your urine clear or pale yellow. Sports drinks can provide valuable electrolytes, sugars, and hydration. °· Get plenty of rest and maintain proper nutrition. Soups and broths with crackers or rice are fine. °SEEK IMMEDIATE MEDICAL CARE IF:  °· You have severe headaches, shortness of breath, chest pain, neck pain, or an unusual rash. °· You have uncontrolled vomiting, diarrhea, or you are unable to keep down fluids. °· You or your child has an oral temperature above 102° F (38.9° C), not controlled by medicine. °· Your baby is older than 3 months with a rectal temperature of 102° F (38.9° C) or higher. °· Your baby is 3  months old or younger with a rectal temperature of 100.4° F (38° C) or higher. °MAKE SURE YOU:  °· Understand these instructions. °· Will watch your condition. °· Will get help right away if you are not doing well or get worse. °  °This information is not intended to replace advice given to you by your health care provider. Make sure you discuss any questions you have with your health care provider. °  °Document Released: 04/01/2005 Document Revised: 09/14/2011 Document Reviewed: 11/28/2014 °[employer]vier Interactive Patient Education ©2016 [employer] ° °

## 2015-10-09 NOTE — ED Notes (Signed)
Pt presents to the ER from home with complaints of fever since 04:00 am pt reports he took 800mg  of Ibuprofen. Pt reports neck soreness and feeling dizzy. Pt talks in complete sentences no respiratory distress noted.

## 2015-10-10 LAB — HERPES SIMPLEX VIRUS(HSV) DNA BY PCR
HSV 1 DNA: NEGATIVE
HSV 2 DNA: NEGATIVE

## 2015-10-12 LAB — CSF CULTURE: CULTURE: NO GROWTH

## 2015-10-12 LAB — CSF CULTURE W GRAM STAIN: Special Requests: NORMAL

## 2015-10-14 LAB — CULTURE, BLOOD (ROUTINE X 2)
CULTURE: NO GROWTH
CULTURE: NO GROWTH

## 2024-06-27 DIAGNOSIS — F32A Depression, unspecified: Secondary | ICD-10-CM | POA: Diagnosis not present

## 2024-06-27 DIAGNOSIS — B20 Human immunodeficiency virus [HIV] disease: Secondary | ICD-10-CM | POA: Diagnosis not present

## 2024-06-27 DIAGNOSIS — K0889 Other specified disorders of teeth and supporting structures: Secondary | ICD-10-CM | POA: Diagnosis not present

## 2024-06-27 DIAGNOSIS — R41840 Attention and concentration deficit: Secondary | ICD-10-CM | POA: Diagnosis not present

## 2024-06-27 DIAGNOSIS — Z1159 Encounter for screening for other viral diseases: Secondary | ICD-10-CM | POA: Diagnosis not present

## 2024-06-27 DIAGNOSIS — Z23 Encounter for immunization: Secondary | ICD-10-CM | POA: Diagnosis not present
